# Patient Record
Sex: Female | Born: 1970 | Race: White | Hispanic: No | Marital: Married | Smoking: Current every day smoker
Health system: Southern US, Community
[De-identification: ages and names within clinical notes are randomized; demographics above are authoritative.]

## PROBLEM LIST (undated history)

## (undated) DIAGNOSIS — F445 Conversion disorder with seizures or convulsions: Secondary | ICD-10-CM

## (undated) DIAGNOSIS — F329 Major depressive disorder, single episode, unspecified: Secondary | ICD-10-CM

## (undated) DIAGNOSIS — F419 Anxiety disorder, unspecified: Secondary | ICD-10-CM

## (undated) DIAGNOSIS — G459 Transient cerebral ischemic attack, unspecified: Secondary | ICD-10-CM

## (undated) DIAGNOSIS — R569 Unspecified convulsions: Secondary | ICD-10-CM

## (undated) DIAGNOSIS — K219 Gastro-esophageal reflux disease without esophagitis: Secondary | ICD-10-CM

## (undated) HISTORY — PX: APPENDECTOMY: SHX54

## (undated) HISTORY — PX: BREAST SURGERY: SHX581

## (undated) HISTORY — PX: ABDOMINAL HYSTERECTOMY: SHX81

---

## 2005-10-13 ENCOUNTER — Inpatient Hospital Stay (HOSPITAL_COMMUNITY): Admission: RE | Admit: 2005-10-13 | Discharge: 2005-10-17 | Payer: Self-pay | Admitting: Psychiatry

## 2005-10-14 ENCOUNTER — Ambulatory Visit: Payer: Self-pay | Admitting: Psychiatry

## 2011-01-02 ENCOUNTER — Emergency Department (INDEPENDENT_AMBULATORY_CARE_PROVIDER_SITE_OTHER): Payer: Self-pay

## 2011-01-02 ENCOUNTER — Emergency Department (HOSPITAL_BASED_OUTPATIENT_CLINIC_OR_DEPARTMENT_OTHER)
Admission: EM | Admit: 2011-01-02 | Discharge: 2011-01-02 | Disposition: A | Discharge status: Home / Self Care | Payer: Self-pay | Attending: Emergency Medicine | Admitting: Emergency Medicine

## 2011-01-02 DIAGNOSIS — R109 Unspecified abdominal pain: Secondary | ICD-10-CM

## 2011-01-02 DIAGNOSIS — R1031 Right lower quadrant pain: Secondary | ICD-10-CM | POA: Insufficient documentation

## 2011-01-02 LAB — URINALYSIS, ROUTINE W REFLEX MICROSCOPIC
Bilirubin Urine: NEGATIVE
Glucose, UA: NEGATIVE mg/dL
Ketones, ur: NEGATIVE mg/dL
Protein, ur: NEGATIVE mg/dL
pH: 6.5 (ref 5.0–8.0)

## 2011-01-02 LAB — BASIC METABOLIC PANEL
BUN: 9 mg/dL (ref 6–23)
Calcium: 9.3 mg/dL (ref 8.4–10.5)
Creatinine, Ser: 0.8 mg/dL (ref 0.4–1.2)
GFR calc non Af Amer: >60 mL/min (ref >60)
Glucose, Bld: 114 mg/dL — ABNORMAL HIGH (ref 70–99)
Potassium: 3.8 mEq/L (ref 3.5–5.1)

## 2011-01-02 LAB — DIFFERENTIAL
Eosinophils Absolute: 0.1 10*3/uL (ref 0.0–0.7)
Eosinophils Relative: 1 % (ref 0–5)
Lymphocytes Relative: 28 % (ref 12–46)
Lymphs Abs: 2.1 10*3/uL (ref 0.7–4.0)
Monocytes Absolute: 0.3 10*3/uL (ref 0.1–1.0)
Monocytes Relative: 4 % (ref 3–12)

## 2011-01-02 LAB — CBC
HCT: 40.2 % (ref 36.0–46.0)
MCH: 31.9 pg (ref 26.0–34.0)
MCHC: 35.1 g/dL (ref 30.0–36.0)
MCV: 91 fL (ref 78.0–100.0)
Platelets: 216 10*3/uL (ref 150–400)
RDW: 12.5 % (ref 11.5–15.5)
WBC: 7.6 10*3/uL (ref 4.0–10.5)

## 2011-10-11 ENCOUNTER — Encounter (HOSPITAL_BASED_OUTPATIENT_CLINIC_OR_DEPARTMENT_OTHER): Payer: Self-pay | Admitting: Family Medicine

## 2011-10-11 ENCOUNTER — Emergency Department (HOSPITAL_BASED_OUTPATIENT_CLINIC_OR_DEPARTMENT_OTHER)
Admission: EM | Admit: 2011-10-11 | Discharge: 2011-10-11 | Disposition: A | Discharge status: Home / Self Care | Payer: Self-pay | Attending: Emergency Medicine | Admitting: Emergency Medicine

## 2011-10-11 ENCOUNTER — Emergency Department (INDEPENDENT_AMBULATORY_CARE_PROVIDER_SITE_OTHER): Payer: Self-pay

## 2011-10-11 DIAGNOSIS — S161XXA Strain of muscle, fascia and tendon at neck level, initial encounter: Secondary | ICD-10-CM

## 2011-10-11 DIAGNOSIS — R209 Unspecified disturbances of skin sensation: Secondary | ICD-10-CM | POA: Insufficient documentation

## 2011-10-11 DIAGNOSIS — M47812 Spondylosis without myelopathy or radiculopathy, cervical region: Secondary | ICD-10-CM

## 2011-10-11 DIAGNOSIS — M542 Cervicalgia: Secondary | ICD-10-CM

## 2011-10-11 DIAGNOSIS — X58XXXA Exposure to other specified factors, initial encounter: Secondary | ICD-10-CM | POA: Insufficient documentation

## 2011-10-11 DIAGNOSIS — S139XXA Sprain of joints and ligaments of unspecified parts of neck, initial encounter: Secondary | ICD-10-CM | POA: Insufficient documentation

## 2011-10-11 DIAGNOSIS — M25519 Pain in unspecified shoulder: Secondary | ICD-10-CM | POA: Insufficient documentation

## 2011-10-11 MED ORDER — IBUPROFEN 800 MG PO TABS
800.0000 mg | ORAL_TABLET | Freq: Once | ORAL | Status: AC
  Administered 2011-10-11: 800 mg via ORAL
  Filled 2011-10-11: qty 1

## 2011-10-11 MED ORDER — DIAZEPAM 5 MG PO TABS
5.0000 mg | ORAL_TABLET | Freq: Once | ORAL | Status: AC
  Administered 2011-10-11: 5 mg via ORAL
  Filled 2011-10-11: qty 1

## 2011-10-11 MED ORDER — HYDROCODONE-ACETAMINOPHEN 5-325 MG PO TABS: 2.0000 | ORAL_TABLET | Freq: Four times a day (QID) | ORAL | Status: AC | PRN

## 2011-10-11 MED ORDER — DIAZEPAM 5 MG PO TABS: 10.0000 mg | ORAL_TABLET | Freq: Three times a day (TID) | ORAL | Status: AC | PRN

## 2011-10-11 MED ORDER — HYDROCODONE-ACETAMINOPHEN 5-325 MG PO TABS
2.0000 | ORAL_TABLET | Freq: Once | ORAL | Status: AC
  Administered 2011-10-11: 2 via ORAL
  Filled 2011-10-11: qty 2

## 2011-10-11 NOTE — ED Notes (Signed)
Pt c/o left lateral neck pain x 7 days. Pt sts left arm is now feeling numb/tingling from left shoulder to elbow. Pt denies injury. Pt has equal grip and sensation to bilateral hands.

## 2011-10-11 NOTE — ED Provider Notes (Signed)
History  This chart was scribed for Cyndra Numbers, MD by Bennett Scrape. This patient was seen in room MH09/MH09 and the patient's care was started at 4:26PM.  CSN: [id number]  Arrival date & time 10/11/11  1437   First MD Initiated Contact with Patient 10/11/11 1543      Chief Complaint  Patient presents with  . Neck Pain    Patient is a 41 y.o. female presenting with neck pain. The history is provided by the patient. No language interpreter was used.  Neck Pain  This is a new problem. The current episode started more than 1 week ago. The problem occurs constantly. The problem has been gradually worsening. The pain is associated with nothing. There has been no fever. The pain is present in the left side. The pain radiates to the left scapula, left shoulder and left arm. The pain is the same all the time. Associated symptoms include numbness and tingling. Pertinent negatives include no photophobia, no visual change, no chest pain, no weight loss, no headaches, no bowel incontinence, no bladder incontinence, no leg pain and no weakness.    Savannah Golden is a 41 y.o. female who presents to the Emergency Department complaining of two weeks of gradual onset, gradually worsening, constant left-sided neck pain described as a nagging pain. Pt states that the pain radiates into the left shoulder and left arm with associated left arm tingling and numbness. Pt states that she woke up with the pain in her neck. The symptoms have been worse today. She denies injury as the cause of the pain. Pt reports that laying flat on her back improves symptoms.  She has been taking tylenol with no improvement in her symptoms. She reports that she has not seen her PCP for the symptoms. She denies any trouble swallowing or breathing as associate symptoms. She has no h/o chronic medical conditions. She is a current smoker but denies alcohol use.  History reviewed. No pertinent past medical history.  Past Surgical History    Procedure Date  . Appendectomy   . Breast surgery   . Abdominal hysterectomy     History reviewed. No pertinent family history.  History  Substance Use Topics  . Smoking status: Current Everyday Smoker  . Smokeless tobacco: Not on file  . Alcohol Use: No    Review of Systems  Constitutional: Negative for fever, chills and weight loss.  HENT: Positive for neck pain (Left-sided). Negative for sore throat, rhinorrhea and neck stiffness.   Eyes: Negative for photophobia and itching.  Respiratory: Negative for cough and shortness of breath.   Cardiovascular: Negative for chest pain.  Gastrointestinal: Negative for nausea, vomiting, abdominal pain, diarrhea and bowel incontinence.  Genitourinary: Negative for bladder incontinence, dysuria and hematuria.  Musculoskeletal: Negative for back pain.  Skin: Negative for rash.  Neurological: Positive for tingling and numbness. Negative for weakness and headaches.    Allergies  Review of patient's allergies indicates no known allergies.  Home Medications  No current outpatient prescriptions on file.  Triage Vitals: BP 137/86  Pulse 71  Temp(Src) 98.8 F (37.1 C) (Oral)  Resp 16  Ht 5\' 2"  (1.575 m)  Wt 145 lb (65.772 kg)  BMI 26.52 kg/m2  SpO2 100%  Physical Exam  Nursing note and vitals reviewed. Constitutional: She is oriented to person, place, and time. She appears well-developed and well-nourished.       Pt is tearful  HENT:  Head: Normocephalic and atraumatic.  Eyes: Conjunctivae and EOM are normal.  Neck: Normal range of motion. Neck supple.  Cardiovascular: Normal rate, regular rhythm and normal heart sounds.   Pulmonary/Chest: Effort normal and breath sounds normal. No respiratory distress.  Abdominal: Soft. There is no tenderness.  Musculoskeletal: Normal range of motion. She exhibits tenderness. She exhibits no edema.  Neurological: She is alert and oriented to person, place, and time. No cranial nerve deficit.   Skin: Skin is warm and dry. No rash noted.  Psychiatric: She has a normal mood and affect. Her behavior is normal.    ED Course  Procedures (including critical care time)  DIAGNOSTIC STUDIES: Oxygen Saturation is 100% on room air, normal by my interpretation.    COORDINATION OF CARE: 4:43PM-Discussed valium and x-ray with pt and pt agreed to plan.  Labs Reviewed - No data to display  Dg Cervical Spine Complete  10/11/2011  *RADIOLOGY REPORT*  Clinical Data: Left-sided neck pain, numbness and tingling.  CERVICAL SPINE - COMPLETE 4+ VIEW  Comparison: None.  Findings: There is straightening of the cervical spine.  There is mild disc space narrowing with small endplate osteophytes at C4-5 and C5-6.  No apparent significant encroachment upon the canal or foramina.  No facet arthropathy.  No focal osseous lesion.  IMPRESSION: Mid cervical spondylosis at C4-5 and C5-6.  No apparent osteophytic encroachment upon the canal or foramina.  Original Report Authenticated By: Thomasenia Sales, M.D.     1. Neck strain       MDM  Patient was evaluated and was hemodynamically stable.  She did complain of occasional decrease in sensation along lateral left arm.  Plain film was performed to confirm no bony pathology.  This was unremarkable and symptoms were much improved with valium as muscle relaxant and vicodin.  Patient was discharged with prescriptions for both in improved condition.    I personally performed the services described in this documentation, which was scribed in my presence. The recorded information has been reviewed and considered.       Cyndra Numbers, MD 10/13/11 1037

## 2012-12-16 ENCOUNTER — Encounter (HOSPITAL_BASED_OUTPATIENT_CLINIC_OR_DEPARTMENT_OTHER): Payer: Self-pay | Admitting: *Deleted

## 2012-12-16 ENCOUNTER — Emergency Department (HOSPITAL_BASED_OUTPATIENT_CLINIC_OR_DEPARTMENT_OTHER)
Admission: EM | Admit: 2012-12-16 | Discharge: 2012-12-17 | Disposition: A | Discharge status: Home / Self Care | Payer: Self-pay | Attending: Emergency Medicine | Admitting: Emergency Medicine

## 2012-12-16 DIAGNOSIS — R630 Anorexia: Secondary | ICD-10-CM | POA: Insufficient documentation

## 2012-12-16 DIAGNOSIS — Z9089 Acquired absence of other organs: Secondary | ICD-10-CM | POA: Insufficient documentation

## 2012-12-16 DIAGNOSIS — IMO0001 Reserved for inherently not codable concepts without codable children: Secondary | ICD-10-CM | POA: Insufficient documentation

## 2012-12-16 DIAGNOSIS — R059 Cough, unspecified: Secondary | ICD-10-CM | POA: Insufficient documentation

## 2012-12-16 DIAGNOSIS — J069 Acute upper respiratory infection, unspecified: Secondary | ICD-10-CM | POA: Insufficient documentation

## 2012-12-16 DIAGNOSIS — R05 Cough: Secondary | ICD-10-CM

## 2012-12-16 DIAGNOSIS — R509 Fever, unspecified: Secondary | ICD-10-CM | POA: Insufficient documentation

## 2012-12-16 DIAGNOSIS — G40909 Epilepsy, unspecified, not intractable, without status epilepticus: Secondary | ICD-10-CM | POA: Insufficient documentation

## 2012-12-16 DIAGNOSIS — Z79899 Other long term (current) drug therapy: Secondary | ICD-10-CM | POA: Insufficient documentation

## 2012-12-16 DIAGNOSIS — Z90710 Acquired absence of both cervix and uterus: Secondary | ICD-10-CM | POA: Insufficient documentation

## 2012-12-16 DIAGNOSIS — R110 Nausea: Secondary | ICD-10-CM | POA: Insufficient documentation

## 2012-12-16 DIAGNOSIS — F17200 Nicotine dependence, unspecified, uncomplicated: Secondary | ICD-10-CM | POA: Insufficient documentation

## 2012-12-16 HISTORY — DX: Unspecified convulsions: R56.9

## 2012-12-16 LAB — URINALYSIS, ROUTINE W REFLEX MICROSCOPIC
Ketones, ur: 15 mg/dL — AB
Nitrite: NEGATIVE
Protein, ur: NEGATIVE mg/dL
pH: 5 (ref 5.0–8.0)

## 2012-12-16 NOTE — ED Notes (Signed)
Pt states she was awakened at 0500 with RUQ pain.. Fever 101 since Thursday. Taking Ibuprofen. Dry cough. +nausea. Decreased appetite.

## 2012-12-17 ENCOUNTER — Emergency Department (HOSPITAL_BASED_OUTPATIENT_CLINIC_OR_DEPARTMENT_OTHER): Payer: Self-pay

## 2012-12-17 LAB — CBC WITH DIFFERENTIAL/PLATELET
Eosinophils Relative: 0 % (ref 0–5)
HCT: 43.4 % (ref 36.0–46.0)
Lymphocytes Relative: 27 % (ref 12–46)
Lymphs Abs: 1.5 10*3/uL (ref 0.7–4.0)
MCV: 93.9 fL (ref 78.0–100.0)
Neutro Abs: 3.7 10*3/uL (ref 1.7–7.7)
Platelets: 166 10*3/uL (ref 150–400)
RBC: 4.62 MIL/uL (ref 3.87–5.11)
WBC: 5.8 10*3/uL (ref 4.0–10.5)

## 2012-12-17 LAB — COMPREHENSIVE METABOLIC PANEL
ALT: 10 U/L (ref 0–35)
Alkaline Phosphatase: 89 U/L (ref 39–117)
CO2: 22 mEq/L (ref 19–32)
Calcium: 9.5 mg/dL (ref 8.4–10.5)
Chloride: 104 mEq/L (ref 96–112)
GFR calc Af Amer: >90 mL/min (ref >90)
GFR calc non Af Amer: >90 mL/min (ref >90)
Glucose, Bld: 105 mg/dL — ABNORMAL HIGH (ref 70–99)
Potassium: 3.6 mEq/L (ref 3.5–5.1)
Sodium: 140 mEq/L (ref 135–145)
Total Bilirubin: 0.3 mg/dL (ref 0.3–1.2)

## 2012-12-17 MED ORDER — LORATADINE 10 MG PO TABS: 10.0000 mg | ORAL_TABLET | Freq: Every day | ORAL | Status: DC

## 2012-12-17 MED ORDER — OXYCODONE-ACETAMINOPHEN 5-325 MG PO TABS
1.0000 | ORAL_TABLET | Freq: Once | ORAL | Status: AC
  Administered 2012-12-17: 1 via ORAL
  Filled 2012-12-17 (×2): qty 1

## 2012-12-17 MED ORDER — NAPROXEN 375 MG PO TABS: 375.0000 mg | ORAL_TABLET | Freq: Two times a day (BID) | ORAL | Status: DC

## 2012-12-17 MED ORDER — BENZONATATE 100 MG PO CAPS: 100.0000 mg | ORAL_CAPSULE | Freq: Three times a day (TID) | ORAL | Status: DC

## 2012-12-17 MED ORDER — KETOROLAC TROMETHAMINE 60 MG/2ML IM SOLN
60.0000 mg | Freq: Once | INTRAMUSCULAR | Status: AC
  Administered 2012-12-17: 60 mg via INTRAMUSCULAR
  Filled 2012-12-17: qty 2

## 2012-12-17 NOTE — ED Provider Notes (Signed)
History    This chart was scribed for Savannah Golden Smitty Cords, MD scribed by Magnus Sinning. The patient was seen in room MH10/MH10 00:19  CSN: [id number]  Arrival date & time 12/16/12  2326   None     Chief Complaint  Patient presents with  . Abdominal Pain    (Consider location/radiation/quality/duration/timing/severity/associated sxs/prior treatment) Patient is a 42 y.o. female presenting with abdominal pain. The history is provided by the patient. No language interpreter was used.  Abdominal Pain Pain location:  R flank Pain quality: aching   Pain radiates to:  Does not radiate Pain severity:  Moderate Onset quality:  Gradual Timing:  Constant Progression:  Unchanged Chronicity:  New Context: recent illness   Context comment:  Has been coughing for several days Relieved by:  Nothing Worsened by:  Nothing tried Associated symptoms: cough, fever and nausea   Associated symptoms: no diarrhea, no shortness of breath, no sore throat and no vomiting   Cough:    Cough characteristics:  Non-productive   Severity:  Moderate   Onset quality:  Gradual   Timing:  Intermittent   Chronicity:  New  Savannah Golden is a 42 y.o. female who presents to the Emergency Department complaining of constant moderate abd pain, onset yesterday morning with associated dry cough, onset 4 days days decreased appetite and nausea.  She has had a dry cough and subjective fevers and myalgias since Thursday am.  Pt states at 4:30am this morning she started having abd pain that is aggravated by cough.  The patient states that she does smoke. Past Medical History  Diagnosis Date  . Seizures     Past Surgical History  Procedure Laterality Date  . Appendectomy    . Breast surgery    . Abdominal hysterectomy      History reviewed. No pertinent family history.  History  Substance Use Topics  . Smoking status: Current Every Day Smoker  . Smokeless tobacco: Not on file  . Alcohol Use: No     Review  of Systems  Constitutional: Positive for fever and appetite change.  HENT: Negative for sore throat.   Respiratory: Positive for cough. Negative for shortness of breath.   Gastrointestinal: Positive for nausea and abdominal pain. Negative for vomiting and diarrhea.  All other systems reviewed and are negative.    Allergies  Review of patient's allergies indicates no known allergies.  Home Medications   Current Outpatient Rx  Name  Route  Sig  Dispense  Refill  . lamoTRIgine (LAMICTAL) 100 MG tablet   Oral   Take 100 mg by mouth 3 (three) times daily.           BP 118/79  Pulse 87  Temp(Src) 98.9 F (37.2 C) (Oral)  SpO2 100%  Physical Exam  Nursing note and vitals reviewed. Constitutional: She is oriented to person, place, and time. She appears well-developed and well-nourished. No distress.  HENT:  Head: Normocephalic and atraumatic.  Mouth/Throat: Oropharynx is clear and moist. No oropharyngeal exudate.  Eyes: Conjunctivae and EOM are normal. Pupils are equal, round, and reactive to light.  Neck: Normal range of motion. Neck supple. No tracheal deviation present.  Cardiovascular: Normal rate, regular rhythm and intact distal pulses.   Pulmonary/Chest: Effort normal and breath sounds normal. No stridor. No respiratory distress. She has no wheezes. She has no rales.  Abdominal: Soft. She exhibits no distension. There is no tenderness. There is no rebound and no guarding.  Hyperactive bowel sounds  Musculoskeletal: Normal  range of motion.  Lymphadenopathy:    She has no cervical adenopathy.  Neurological: She is alert and oriented to person, place, and time. No sensory deficit.  Skin: Skin is warm and dry.  Psychiatric: She has a normal mood and affect. Her behavior is normal.    ED Course  Procedures (including critical care time) DIAGNOSTIC STUDIES: Oxygen Saturation is 100% on room air, normal by my interpretation.    COORDINATION OF CARE:  Labs Reviewed   URINALYSIS, ROUTINE W REFLEX MICROSCOPIC - Abnormal; Notable for the following:    Color, Urine AMBER (*)    APPearance CLOUDY (*)    Specific Gravity, Urine 1.035 (*)    Hgb urine dipstick SMALL (*)    Bilirubin Urine SMALL (*)    Ketones, ur 15 (*)    All other components within normal limits  CBC WITH DIFFERENTIAL - Abnormal; Notable for the following:    Hemoglobin 15.1 (*)    All other components within normal limits  URINE MICROSCOPIC-ADD ON - Abnormal; Notable for the following:    Squamous Epithelial / LPF FEW (*)    Bacteria, UA FEW (*)    All other components within normal limits  COMPREHENSIVE METABOLIC PANEL  LIPASE, BLOOD   No results found.   No diagnosis found.   PERC negative and wells 0 MDM  Suspect pain is secondary to pulled muscle from coughing.  Will treat for URI.  Return for worsening symptoms   I personally performed the services described in this documentation, which was scribed in my presence. The recorded information has been reviewed and is accurate.          Jasmine Awe, MD 12/17/12 [phone]

## 2014-09-16 ENCOUNTER — Emergency Department (HOSPITAL_BASED_OUTPATIENT_CLINIC_OR_DEPARTMENT_OTHER): Payer: Self-pay

## 2014-09-16 ENCOUNTER — Observation Stay (HOSPITAL_BASED_OUTPATIENT_CLINIC_OR_DEPARTMENT_OTHER)
Admission: EM | Admit: 2014-09-16 | Discharge: 2014-09-17 | Disposition: A | Discharge status: Home / Self Care | Payer: Self-pay | Attending: Internal Medicine | Admitting: Internal Medicine

## 2014-09-16 ENCOUNTER — Encounter (HOSPITAL_BASED_OUTPATIENT_CLINIC_OR_DEPARTMENT_OTHER): Payer: Self-pay | Admitting: *Deleted

## 2014-09-16 DIAGNOSIS — R569 Unspecified convulsions: Principal | ICD-10-CM | POA: Insufficient documentation

## 2014-09-16 DIAGNOSIS — K219 Gastro-esophageal reflux disease without esophagitis: Secondary | ICD-10-CM | POA: Insufficient documentation

## 2014-09-16 DIAGNOSIS — W010XXA Fall on same level from slipping, tripping and stumbling without subsequent striking against object, initial encounter: Secondary | ICD-10-CM | POA: Insufficient documentation

## 2014-09-16 DIAGNOSIS — Y9389 Activity, other specified: Secondary | ICD-10-CM | POA: Insufficient documentation

## 2014-09-16 DIAGNOSIS — R471 Dysarthria and anarthria: Secondary | ICD-10-CM

## 2014-09-16 DIAGNOSIS — Z8673 Personal history of transient ischemic attack (TIA), and cerebral infarction without residual deficits: Secondary | ICD-10-CM | POA: Insufficient documentation

## 2014-09-16 DIAGNOSIS — F17210 Nicotine dependence, cigarettes, uncomplicated: Secondary | ICD-10-CM | POA: Insufficient documentation

## 2014-09-16 DIAGNOSIS — R55 Syncope and collapse: Secondary | ICD-10-CM | POA: Insufficient documentation

## 2014-09-16 DIAGNOSIS — Y9289 Other specified places as the place of occurrence of the external cause: Secondary | ICD-10-CM | POA: Insufficient documentation

## 2014-09-16 DIAGNOSIS — Y998 Other external cause status: Secondary | ICD-10-CM | POA: Insufficient documentation

## 2014-09-16 DIAGNOSIS — R202 Paresthesia of skin: Secondary | ICD-10-CM | POA: Insufficient documentation

## 2014-09-16 HISTORY — DX: Gastro-esophageal reflux disease without esophagitis: K21.9

## 2014-09-16 HISTORY — DX: Transient cerebral ischemic attack, unspecified: G45.9

## 2014-09-16 LAB — CBC WITH DIFFERENTIAL/PLATELET
BASOS ABS: 0 10*3/uL (ref 0.0–0.1)
BASOS PCT: 0 % (ref 0–1)
EOS ABS: 0.1 10*3/uL (ref 0.0–0.7)
Eosinophils Relative: 1 % (ref 0–5)
HCT: 43.3 % (ref 36.0–46.0)
Hemoglobin: 14.9 g/dL (ref 12.0–15.0)
Lymphocytes Relative: 34 % (ref 12–46)
Lymphs Abs: 2.5 10*3/uL (ref 0.7–4.0)
MCH: 32.3 pg (ref 26.0–34.0)
MCHC: 34.4 g/dL (ref 30.0–36.0)
MCV: 93.7 fL (ref 78.0–100.0)
MONOS PCT: 8 % (ref 3–12)
Monocytes Absolute: 0.5 10*3/uL (ref 0.1–1.0)
NEUTROS PCT: 57 % (ref 43–77)
Neutro Abs: 4.1 10*3/uL (ref 1.7–7.7)
PLATELETS: 207 10*3/uL (ref 150–400)
RBC: 4.62 MIL/uL (ref 3.87–5.11)
RDW: 12.2 % (ref 11.5–15.5)
WBC: 7.2 10*3/uL (ref 4.0–10.5)

## 2014-09-16 LAB — URINALYSIS, ROUTINE W REFLEX MICROSCOPIC
Glucose, UA: NEGATIVE mg/dL
Hgb urine dipstick: NEGATIVE
Ketones, ur: 15 mg/dL — AB
Leukocytes, UA: NEGATIVE
Nitrite: NEGATIVE
PH: 6 (ref 5.0–8.0)
Protein, ur: NEGATIVE mg/dL
SPECIFIC GRAVITY, URINE: 1.026 (ref 1.005–1.030)
UROBILINOGEN UA: 1 mg/dL (ref 0.0–1.0)

## 2014-09-16 LAB — COMPREHENSIVE METABOLIC PANEL
ALBUMIN: 3.9 g/dL (ref 3.5–5.2)
ALK PHOS: 73 U/L (ref 39–117)
ALT: 10 U/L (ref 0–35)
ANION GAP: 13 (ref 5–15)
AST: 13 U/L (ref 0–37)
BUN: 12 mg/dL (ref 6–23)
CO2: 27 mEq/L (ref 19–32)
Calcium: 9.6 mg/dL (ref 8.4–10.5)
Chloride: 103 mEq/L (ref 96–112)
Creatinine, Ser: 0.8 mg/dL (ref 0.50–1.10)
GFR calc Af Amer: >90 mL/min (ref >90)
GFR calc non Af Amer: 90 mL/min — ABNORMAL LOW (ref >90)
Glucose, Bld: 101 mg/dL — ABNORMAL HIGH (ref 70–99)
POTASSIUM: 3.5 meq/L — AB (ref 3.7–5.3)
SODIUM: 143 meq/L (ref 137–147)
TOTAL PROTEIN: 6.6 g/dL (ref 6.0–8.3)
Total Bilirubin: 0.3 mg/dL (ref 0.3–1.2)

## 2014-09-16 NOTE — ED Notes (Signed)
Patient would like to go to [facility] rather than [facility]. The delay explained to the patient and family.

## 2014-09-16 NOTE — ED Provider Notes (Signed)
CSN: [id number]     Arrival date & time 09/16/14  1744 History  This chart was scribed for Tilden FossaElizabeth Burgundy Matuszak, MD by [facility] Abu Hashem, ED Scribe. The patient was seen in MH08/MH08 and the patient's care was started at 7:12 PM.   Chief Complaint  Patient presents with  . Numbness   The history is provided by the patient and the spouse. No language interpreter was used.    HPI Comments: Butler DenmarkDebra Spangler is a 43 y.o. female with a history of seizures who presents to the Emergency Department complaining of difficulty with speech onset last night. She has numbness on the left side of her face and teeth. The sensation is described as an ache and tightness. Pt was very stressed last night and had a syncope episode. He she fell down but did not hit her head. Shortly after the fall pt had a seizure. After coming too she appeared confused, saw doubles and had difficulty with speech. Her husband helped her up and her gait was abnormal described as "high stepping". The seizure lasted 2 min, pt is taking Lamictal and is complaint with her medication.   She has FHx of DM and heart attack. Pt does smoke.  Past Medical History  Diagnosis Date  . Seizures   . TIA (transient ischemic attack)    Past Surgical History  Procedure Laterality Date  . Appendectomy    . Breast surgery    . Abdominal hysterectomy     No family history on file. History  Substance Use Topics  . Smoking status: Current Every Day Smoker  . Smokeless tobacco: Not on file  . Alcohol Use: No   OB History    No data available     Review of Systems  HENT: Positive for voice change.   Eyes: Positive for visual disturbance.  Musculoskeletal: Positive for gait problem.  Neurological: Positive for numbness.  All other systems reviewed and are negative.   Allergies  Review of patient's allergies indicates no known allergies.  Home Medications   Prior to Admission medications   Medication Sig Start Date End Date Taking? Authorizing  Provider  lamoTRIgine (LAMICTAL) 100 MG tablet Take 100 mg by mouth 3 (three) times daily.    Historical Provider, MD   BP 118/72 mmHg  Pulse 83  Temp(Src) 98.3 F (36.8 C) (Oral)  Resp 18  Ht 5\' 2"  (1.575 m)  Wt 114 lb (51.71 kg)  BMI 20.85 kg/m2  SpO2 100% Physical Exam  Constitutional: She is oriented to person, place, and time. She appears well-developed and well-nourished.  HENT:  Head: Normocephalic and atraumatic.  Cardiovascular: Normal rate and regular rhythm.   No murmur heard. Pulmonary/Chest: Effort normal and breath sounds normal. No respiratory distress.  Abdominal: Soft. There is no tenderness. There is no rebound and no guarding.  Musculoskeletal: She exhibits no edema or tenderness.  Neurological: She is alert and oriented to person, place, and time.  Decreased sensation to light touch and slight weakness of lower face. 5/5 strength in all four extremities, sensation to light touch intact in all four extremities.  Slightly mumbled speech.  No pronator drift.   Skin: Skin is warm and dry.  Psychiatric: She has a normal mood and affect. Her behavior is normal.  Nursing note and vitals reviewed.   ED Course  Procedures  DIAGNOSTIC STUDIES: Oxygen Saturation is 100% on room air, normal by my interpretation.    COORDINATION OF CARE: 7:18 PM Discussed treatment plan with pt at  bedside and pt agreed to plan.  Labs Review Labs Reviewed  COMPREHENSIVE METABOLIC PANEL - Abnormal; Notable for the following:    Potassium 3.5 (*)    Glucose, Bld 101 (*)    GFR calc non Af Amer 90 (*)    All other components within normal limits  URINALYSIS, ROUTINE W REFLEX MICROSCOPIC - Abnormal; Notable for the following:    APPearance CLOUDY (*)    Bilirubin Urine SMALL (*)    Ketones, ur 15 (*)    All other components within normal limits  CBC WITH DIFFERENTIAL   Imaging Review Dg Chest 2 View  09/16/2014   CLINICAL DATA:  Syncopal episode with fall and unresponsiveness  last night. Subsequent possible seizure activity. Confusion. Initial encounter.  EXAM: CHEST  2 VIEW  COMPARISON:  Radiographs 08/07/2014.  FINDINGS: The heart size and mediastinal contours are normal. The lungs are clear. There is no pleural effusion or pneumothorax. No acute osseous findings are identified.  IMPRESSION: No active cardiopulmonary process.   Electronically Signed   By: Roxy HorsemanBill  Veazey M.D.   On: 09/16/2014 20:59   Ct Head Wo Contrast  09/16/2014   CLINICAL DATA:  Initial evaluation for numbness.  EXAM: CT HEAD WITHOUT CONTRAST  TECHNIQUE: Contiguous axial images were obtained from the base of the skull through the vertex without intravenous contrast.  COMPARISON:  Prior CT from 01/09/2013  FINDINGS: There is no acute intracranial hemorrhage or infarct. No mass lesion or midline shift. Gray-white matter differentiation is well maintained. Ventricles are normal in size without evidence of hydrocephalus. CSF containing spaces are within normal limits. No extra-axial fluid collection.  The calvarium is intact.  Orbital soft tissues are within normal limits.  The paranasal sinuses and mastoid air cells are well pneumatized and free of fluid.  Scalp soft tissues are unremarkable.  IMPRESSION: Normal head CT with no acute intracranial process identified.   Electronically Signed   By: Rise MuBenjamin  McClintock M.D.   On: 09/16/2014 21:06     EKG Interpretation   Date/Time:  Monday September 16 2014 20:05:23 EST Ventricular Rate:  69 PR Interval:  152 QRS Duration: 102 QT Interval:  408 QTC Calculation: 437 R Axis:   0 Text Interpretation:  Normal sinus rhythm with sinus arrhythmia Incomplete  right bundle branch block Borderline ECG Confirmed by Lincoln Brighamees, Liz [phone]([id number]) on  09/16/2014 8:28:10 PM      MDM   Final diagnoses:  Paresthesia  Dysarthria    Patient here for evaluation of left-sided facial numbness as well as speech changes that have been ongoing since yesterday after a seizure  episode. Patient does have some speech abnormalities as well as mild left-sided lower facial weakness. There are no changes on CT of her brain consistent with mass or recent stroke. Discussed with neurohospitalist, Dr. Hosie PoissonSumner, patient's history, presentation, examination, he recommends admission for further evaluation to rule out stroke. Patient is not a TPA candidate given symptoms have been ongoing for 24 hours. Discussed with hospitalist, Dr. Selena BattenKim regarding admission and transfer.  Patient updated of plan and [facility]  I personally performed the services described in this documentation, which was scribed in my presence. The recorded information has been reviewed and is accurate.   Tilden FossaElizabeth Wava Kildow, MD 09/16/14 [phone]

## 2014-09-16 NOTE — Progress Notes (Signed)
Patient arrived to [facility] from [facility]. Patient oriented to room and unit. VSS. Admissions paged about patients arrival to hospital. Will continue to monitor closely. Monia PouchShakenna Trinka Keshishyan, RN

## 2014-09-16 NOTE — ED Notes (Signed)
2330 last pm syncopal episode with seizure  Since   Garbled speech  And numbness left arm and side of face

## 2014-09-16 NOTE — ED Notes (Signed)
Pt reports a "stressful" event last night around 2230.  Husband states pt walked into a room, started to speak to him and "fell to the ground".  Pt was unresponsive for approximately 3 minutes with seizure activity/movement.  Pt was confused and speech was abnormal described as slurred.  She also had an abnormal gait described as "high stepping" with husband having to hold her up.  Pt tells me she has a sensation of tightening of left side of face and teeth hurting, left arm is aching and she just doesn't feel right.  Pt is compliant with medications, denies headache or chest pain.

## 2014-09-16 NOTE — Progress Notes (Signed)
ED requested evaluation of pt for seizure.  Neurology has apparently agreed to consult per ED.  [facility].

## 2014-09-16 NOTE — ED Notes (Signed)
MD at bedside and patient is now agreeable to go to [facility]

## 2014-09-17 ENCOUNTER — Observation Stay (HOSPITAL_COMMUNITY): Payer: Self-pay

## 2014-09-17 ENCOUNTER — Encounter (HOSPITAL_COMMUNITY): Payer: Self-pay | Admitting: Internal Medicine

## 2014-09-17 DIAGNOSIS — R55 Syncope and collapse: Secondary | ICD-10-CM | POA: Diagnosis present

## 2014-09-17 DIAGNOSIS — R569 Unspecified convulsions: Secondary | ICD-10-CM

## 2014-09-17 DIAGNOSIS — R202 Paresthesia of skin: Secondary | ICD-10-CM

## 2014-09-17 LAB — TROPONIN I

## 2014-09-17 MED ORDER — ACETAMINOPHEN 650 MG RE SUPP: 650.0000 mg | Freq: Four times a day (QID) | RECTAL | Status: DC | PRN

## 2014-09-17 MED ORDER — LAMOTRIGINE 100 MG PO TABS: ORAL_TABLET | ORAL | Status: DC

## 2014-09-17 MED ORDER — PANTOPRAZOLE SODIUM 40 MG PO TBEC
40.0000 mg | DELAYED_RELEASE_TABLET | Freq: Every day | ORAL | Status: DC
  Administered 2014-09-17: 40 mg via ORAL
  Filled 2014-09-17 (×2): qty 1

## 2014-09-17 MED ORDER — ZOLPIDEM TARTRATE 5 MG PO TABS
5.0000 mg | ORAL_TABLET | Freq: Every evening | ORAL | Status: DC | PRN
  Administered 2014-09-17: 5 mg via ORAL
  Filled 2014-09-17: qty 1

## 2014-09-17 MED ORDER — SODIUM CHLORIDE 0.9 % IV SOLN
INTRAVENOUS | Status: DC
  Administered 2014-09-17: 1000 mL via INTRAVENOUS

## 2014-09-17 MED ORDER — POTASSIUM CHLORIDE CRYS ER 20 MEQ PO TBCR
20.0000 meq | EXTENDED_RELEASE_TABLET | Freq: Once | ORAL | Status: AC
  Administered 2014-09-17: 20 meq via ORAL
  Filled 2014-09-17: qty 1

## 2014-09-17 MED ORDER — ASPIRIN EC 325 MG PO TBEC
325.0000 mg | DELAYED_RELEASE_TABLET | Freq: Every day | ORAL | Status: DC
  Administered 2014-09-17: 325 mg via ORAL
  Filled 2014-09-17: qty 1

## 2014-09-17 MED ORDER — LORAZEPAM 2 MG/ML IJ SOLN
0.5000 mg | INTRAMUSCULAR | Status: DC | PRN
  Administered 2014-09-17: 0.5 mg via INTRAVENOUS

## 2014-09-17 MED ORDER — ACETAMINOPHEN 325 MG PO TABS: 650.0000 mg | ORAL_TABLET | Freq: Four times a day (QID) | ORAL | Status: DC | PRN

## 2014-09-17 MED ORDER — LAMOTRIGINE 100 MG PO TABS
100.0000 mg | ORAL_TABLET | Freq: Three times a day (TID) | ORAL | Status: DC
  Administered 2014-09-17 (×2): 100 mg via ORAL
  Filled 2014-09-17 (×2): qty 1

## 2014-09-17 MED ORDER — LORAZEPAM 2 MG/ML IJ SOLN
INTRAMUSCULAR | Status: AC
  Filled 2014-09-17: qty 1

## 2014-09-17 MED ORDER — SODIUM CHLORIDE 0.9 % IJ SOLN
3.0000 mL | Freq: Two times a day (BID) | INTRAMUSCULAR | Status: DC
  Administered 2014-09-17: 3 mL via INTRAVENOUS

## 2014-09-17 NOTE — Procedures (Signed)
History: 43 yo F with seizures  Sedation: None  Technique: This is a 17 channel routine scalp EEG performed at the bedside with bipolar and monopolar montages arranged in accordance to the international 10/20 system of electrode placement. One channel was dedicated to EKG recording.    Background: There is a well defined posterior dominant rhythm of 9 Hz that attenuates with eye opening. The waking background consists of intermixed alpha and beta activities. There is anterior shifting of the PDR with drowsiness and sleep structures with a normla distribution are observed.   Photic stimulation: Physiologic driving is present  EEG Abnormalities: None  Clinical Interpretation: This normal EEG is recorded in the waking and sleep state. There was no seizure or seizure predisposition recorded on this study.   Ritta SlotMcNeill Asianae Minkler, MD [facility] [phone]  If 7pm- 7am, please page neurology on call as listed in AMION.

## 2014-09-17 NOTE — Progress Notes (Signed)
Patient discharged home with husband. Discharge instructions and medications reviewed and given to patient. Both patient and husband state they understand discharge instructions and medications. IV removed, tele removed. Patient told to follow up with primary care doctor in a week.

## 2014-09-17 NOTE — H&P (Addendum)
Savannah Golden is an 43 y.o. female.     Pcp: [facility]  Chief Complaint: syncope HPI: 43 yo female with hx of seizure apparently about 10:30-pm last nite collapsed on floor and had seizure. "generalized trembling"  Witnessed by her husband, speech was like a baby speech when she came to.  Blurred vision when she initially regained consciousness.  No incontinence or tongue biting.  Pt went to ED for evaluation and CT scan was negative.  Sent to [facility] for evaluation and MRI.    Past Medical History  Diagnosis Date  . Seizures   . TIA (transient ischemic attack)   . GERD (gastroesophageal reflux disease)     Past Surgical History  Procedure Laterality Date  . Appendectomy    . Breast surgery    . Abdominal hysterectomy      Family History  Problem Relation Age of Onset  . Diabetes Father     had amputation due to diabetes   Social History:  reports that she has been smoking Cigarettes.  She has a 20 pack-year smoking history. She does not have any smokeless tobacco history on file. She reports that she does not drink alcohol or use illicit drugs.  Allergies: No Known Allergies  Medications Prior to Admission  Medication Sig Dispense Refill  . lamoTRIgine (LAMICTAL) 100 MG tablet Take 100 mg by mouth 3 (three) times daily.      Results for orders placed or performed during the hospital encounter of 09/16/14 (from the past 48 hour(s))  CBC with Differential     Status: None   Collection Time: 09/16/14  6:53 PM  Result Value Ref Range   WBC 7.2 4.0 - 10.5 K/uL   RBC 4.62 3.87 - 5.11 MIL/uL   Hemoglobin 14.9 12.0 - 15.0 g/dL   HCT 43.3 36.0 - 46.0 %   MCV 93.7 78.0 - 100.0 fL   MCH 32.3 26.0 - 34.0 pg   MCHC 34.4 30.0 - 36.0 g/dL   RDW 12.2 11.5 - 15.5 %   Platelets 207 150 - 400 K/uL   Neutrophils Relative % 57 43 - 77 %   Neutro Abs 4.1 1.7 - 7.7 K/uL   Lymphocytes Relative 34 12 - 46 %   Lymphs Abs 2.5 0.7 - 4.0 K/uL   Monocytes Relative 8 3 - 12  %   Monocytes Absolute 0.5 0.1 - 1.0 K/uL   Eosinophils Relative 1 0 - 5 %   Eosinophils Absolute 0.1 0.0 - 0.7 K/uL   Basophils Relative 0 0 - 1 %   Basophils Absolute 0.0 0.0 - 0.1 K/uL  Comprehensive metabolic panel     Status: Abnormal   Collection Time: 09/16/14  6:53 PM  Result Value Ref Range   Sodium 143 137 - 147 mEq/L   Potassium 3.5 (L) 3.7 - 5.3 mEq/L   Chloride 103 96 - 112 mEq/L   CO2 27 19 - 32 mEq/L   Glucose, Bld 101 (H) 70 - 99 mg/dL   BUN 12 6 - 23 mg/dL   Creatinine, Ser 0.80 0.50 - 1.10 mg/dL   Calcium 9.6 8.4 - 10.5 mg/dL   Total Protein 6.6 6.0 - 8.3 g/dL   Albumin 3.9 3.5 - 5.2 g/dL   AST 13 0 - 37 U/L   ALT 10 0 - 35 U/L   Alkaline Phosphatase 73 39 - 117 U/L   Total Bilirubin 0.3 0.3 - 1.2 mg/dL   GFR calc non Af Wyvonnia Lora  90 (L) >90 mL/min   GFR calc Af Amer >90 >90 mL/min    Comment: (NOTE) The eGFR has been calculated using the CKD EPI equation. This calculation has not been validated in all clinical situations. eGFR's persistently <90 mL/min signify possible Chronic Kidney Disease.    Anion gap 13 5 - 15  Urinalysis, Routine w reflex microscopic     Status: Abnormal   Collection Time: 09/16/14  8:08 PM  Result Value Ref Range   Color, Urine YELLOW YELLOW   APPearance CLOUDY (A) CLEAR   Specific Gravity, Urine 1.026 1.005 - 1.030   pH 6.0 5.0 - 8.0   Glucose, UA NEGATIVE NEGATIVE mg/dL   Hgb urine dipstick NEGATIVE NEGATIVE   Bilirubin Urine SMALL (A) NEGATIVE   Ketones, ur 15 (A) NEGATIVE mg/dL   Protein, ur NEGATIVE NEGATIVE mg/dL   Urobilinogen, UA 1.0 0.0 - 1.0 mg/dL   Nitrite NEGATIVE NEGATIVE   Leukocytes, UA NEGATIVE NEGATIVE    Comment: MICROSCOPIC NOT DONE ON URINES WITH NEGATIVE PROTEIN, BLOOD, LEUKOCYTES, NITRITE, OR GLUCOSE <1000 mg/dL.   Dg Chest 2 View  09/16/2014   CLINICAL DATA:  Syncopal episode with fall and unresponsiveness last night. Subsequent possible seizure activity. Confusion. Initial encounter.  EXAM: CHEST  2 VIEW   COMPARISON:  Radiographs 08/07/2014.  FINDINGS: The heart size and mediastinal contours are normal. The lungs are clear. There is no pleural effusion or pneumothorax. No acute osseous findings are identified.  IMPRESSION: No active cardiopulmonary process.   Electronically Signed   By: Camie Patience M.D.   On: 09/16/2014 20:59   Ct Head Wo Contrast  09/16/2014   CLINICAL DATA:  Initial evaluation for numbness.  EXAM: CT HEAD WITHOUT CONTRAST  TECHNIQUE: Contiguous axial images were obtained from the base of the skull through the vertex without intravenous contrast.  COMPARISON:  Prior CT from 01/09/2013  FINDINGS: There is no acute intracranial hemorrhage or infarct. No mass lesion or midline shift. Gray-white matter differentiation is well maintained. Ventricles are normal in size without evidence of hydrocephalus. CSF containing spaces are within normal limits. No extra-axial fluid collection.  The calvarium is intact.  Orbital soft tissues are within normal limits.  The paranasal sinuses and mastoid air cells are well pneumatized and free of fluid.  Scalp soft tissues are unremarkable.  IMPRESSION: Normal head CT with no acute intracranial process identified.   Electronically Signed   By: Jeannine Boga M.D.   On: 09/16/2014 21:06    Review of Systems  Constitutional: Negative for fever, chills, weight loss, malaise/fatigue and diaphoresis.  HENT: Negative for congestion, ear discharge, ear pain, hearing loss, nosebleeds, sore throat and tinnitus.   Eyes: Negative for blurred vision, double vision, photophobia, pain, discharge and redness.  Respiratory: Negative for cough, hemoptysis, sputum production, shortness of breath and stridor.   Cardiovascular: Negative for chest pain, palpitations, orthopnea, claudication, leg swelling and PND.  Gastrointestinal: Negative for heartburn, nausea, vomiting, abdominal pain, diarrhea, constipation, blood in stool and melena.  Genitourinary: Negative for  dysuria, urgency, frequency, hematuria and flank pain.  Musculoskeletal: Negative for myalgias, back pain, joint pain, falls and neck pain.  Skin: Negative for itching and rash.  Neurological: Positive for speech change and seizures. Negative for dizziness, tingling, tremors, sensory change, focal weakness, loss of consciousness, weakness and headaches.  Endo/Heme/Allergies: Negative for environmental allergies and polydipsia. Does not bruise/bleed easily.  Psychiatric/Behavioral: Negative for depression, suicidal ideas, hallucinations, memory loss and substance abuse. The patient is not nervous/anxious  and does not have insomnia.     Blood pressure 120/76, pulse 72, temperature 98.4 F (36.9 C), temperature source Oral, resp. rate 16, height $RemoveBe'5\' 2"'dPDFlxzfa$  (1.575 m), weight 51.71 kg (114 lb), SpO2 98 %. Physical Exam  Constitutional: She is oriented to person, place, and time. She appears well-developed and well-nourished.  HENT:  Head: Normocephalic and atraumatic.  Eyes: Conjunctivae and EOM are normal. Pupils are equal, round, and reactive to light.  Neck: Normal range of motion. Neck supple. No JVD present. No tracheal deviation present. No thyromegaly present.  Cardiovascular: Normal rate and regular rhythm.  Exam reveals no gallop and no friction rub.   No murmur heard. Respiratory: Effort normal and breath sounds normal. No stridor. No respiratory distress. She has no wheezes. She has no rales. She exhibits no tenderness.  GI: Soft. Bowel sounds are normal. She exhibits no distension. There is no tenderness. There is no rebound and no guarding.  Musculoskeletal: Normal range of motion. She exhibits no edema or tenderness.  Lymphadenopathy:    She has no cervical adenopathy.  Neurological: She is alert and oriented to person, place, and time. She has normal reflexes. She displays normal reflexes. No cranial nerve deficit. She exhibits normal muscle tone. Coordination normal.  Reflexes 2+  symmetric, diffuse with downgoing toes bil  Skin: Skin is warm and dry. No rash noted. No erythema. No pallor.  +tatoo  Psychiatric: She has a normal mood and affect. Her behavior is normal. Judgment and thought content normal.     Assessment/Plan Syncope Tele Check carotid ultrasound, cardiac echo Cycle cardiac markers  Seizure  Check MRI brain withoutand with contrast EEG Appreciate neurology input regarding management and any adjustment of medication  Double vision resolved Anticholinesterase ab    Jani Gravel 09/17/2014, 1:29 AM

## 2014-09-17 NOTE — Discharge Summary (Signed)
Discharge Summary  Savannah Golden UJW:[id number] DOB: 04/08/1971  PCP: Darliss CheneyMichael Hussey, [place] Admit date: 09/16/2014 Discharge date: 09/17/2014  Time spent: 25 minutes  Recommendations for Outpatient Follow-up:  1. Medication change: Lamictal being increased to 200 mg in the morning/150 mg in the evening   Discharge Diagnoses:  Active Hospital Problems   Diagnosis Date Noted  . Seizures 09/17/2014  . Syncope 09/17/2014  . Seizure   . Paresthesia 09/16/2014    Resolved Hospital Problems   Diagnosis Date Noted Date Resolved  No resolved problems to display.    Discharge Condition: Improved, being discharged home  Diet recommendation: Regular  Filed Weights   09/16/14 1810 09/17/14 0100  Weight: 51.71 kg (114 lb) 53.524 kg (118 lb)    History of present illness:  43 year old female with past mental history of seizure presented to the emergency room on 12/21 night after she collapsed and had reports of generalized trembling with secondary blurred vision. In the emergency room, MRI was unrevealing. Patient was brought into the hospital service for evaluation of syncope and seizure  Hospital Course:  Active Problems:   Paresthesia: Improved on the ROM   Seizures: Unclear etiology. The EEG was unrevealing as discussed with neurology. It seemed her paresthesias maybe more organic and not related to a TIA or CVA. Neurology recommended increasing the Lamictal to 200 mg in the morning/150 mg in the evening which was previously 150 twice a day. I felt that this may be more conversion from stress related issues   Syncope: Echo and Dopplers unrevealing., Patient not orthostatic. Again this may be not a true syncopal episode and may be more related to conversion from stress    Procedures:  Echo done 12/22: Normal  Dopplers done 12/22: Prelim report normal  EEG: Preliminary report normal  Consultations:  Neurology  Discharge Exam: BP 109/58 mmHg  Pulse 63  Temp(Src) 98.2  F (36.8 C) (Oral)  Resp 17  Ht 5\' 2"  (1.575 m)  Wt 53.524 kg (118 lb)  BMI 21.58 kg/m2  SpO2 100%  General: Alert and oriented 3, no acute distress Cardiovascular: Regular rate and rhythm, S1-S2 Respiratory: Clear to auscultation bilaterally  Discharge Instructions You were cared for by a hospitalist during your hospital stay. If you have any questions about your discharge medications or the care you received while you were in the hospital after you are discharged, you can call the unit and asked to speak with the hospitalist on call if the hospitalist that took care of you is not available. Once you are discharged, your primary care physician will handle any further medical issues. Please note that NO REFILLS for any discharge medications will be authorized once you are discharged, as it is imperative that you return to your primary care physician (or establish a relationship with a primary care physician if you do not have one) for your aftercare needs so that they can reassess your need for medications and monitor your lab values.     Medication List    TAKE these medications        lamoTRIgine 100 MG tablet  Commonly known as:  LAMICTAL  200 mg (2 tabs in the morning) & 150mg  in the evening       No Known Allergies    The results of significant diagnostics from this hospitalization (including imaging, microbiology, ancillary and laboratory) are listed below for reference.    Significant Diagnostic Studies: Dg Chest 2 View  09/16/2014   CLINICAL DATA:  Syncopal  episode with fall and unresponsiveness last night. Subsequent possible seizure activity. Confusion. Initial encounter.  EXAM: CHEST  2 VIEW  COMPARISON:  Radiographs 08/07/2014.  FINDINGS: The heart size and mediastinal contours are normal. The lungs are clear. There is no pleural effusion or pneumothorax. No acute osseous findings are identified.  IMPRESSION: No active cardiopulmonary process.   Electronically Signed    By: Roxy Horseman M.D.   On: 09/16/2014 20:59   Ct Head Wo Contrast  09/16/2014   CLINICAL DATA:  Initial evaluation for numbness.  EXAM: CT HEAD WITHOUT CONTRAST  TECHNIQUE: Contiguous axial images were obtained from the base of the skull through the vertex without intravenous contrast.  COMPARISON:  Prior CT from 01/09/2013  FINDINGS: There is no acute intracranial hemorrhage or infarct. No mass lesion or midline shift. Gray-white matter differentiation is well maintained. Ventricles are normal in size without evidence of hydrocephalus. CSF containing spaces are within normal limits. No extra-axial fluid collection.  The calvarium is intact.  Orbital soft tissues are within normal limits.  The paranasal sinuses and mastoid air cells are well pneumatized and free of fluid.  Scalp soft tissues are unremarkable.  IMPRESSION: Normal head CT with no acute intracranial process identified.   Electronically Signed   By: Rise Mu M.D.   On: 09/16/2014 21:06   Mr Brain Wo Contrast  09/17/2014   CLINICAL DATA:  History of seizures, syncopal episode 2 days ago with seizure. Change in speech.  EXAM: MRI HEAD WITHOUT CONTRAST  TECHNIQUE: Multiplanar, multiecho pulse sequences of the brain and surrounding structures were obtained without intravenous contrast.  COMPARISON:  CT of the head September 16, 2014 and MRI of the brain August 19, 2012.  FINDINGS: The ventricles and sulci are normal. No abnormal parenchymal signal, mass lesions or mass of affect. Small area reduced diffusion within LEFT superior cerebellum only present on the coronal images, no ADC abnormality. No susceptibility artifact to suggest hemorrhage. Symmetric appearance of the hippocampi, with normal size, morphology and signal characteristics.  No abnormal extra-axial fluid collection. No abnormal calvarial bone marrow signal. Ocular globes and orbital contents are unremarkable though not tailored for evaluation. Visualized paranasal  sinuses and mastoid air cells appear well-aerated. No abnormal sellar expansion. Craniocervical junction is maintained.  IMPRESSION: Small focus of reduced diffusion within LEFT superior cerebellum seen only on the coronal DWI, equivocal for acute ischemia, this may reflect artifact from vessel/skullbase interface.   Electronically Signed   By: Awilda Metro   On: 09/17/2014 03:47    Microbiology: No results found for this or any previous visit (from the past 240 hour(s)).   Labs: Basic Metabolic Panel:  Recent Labs Lab 09/16/14 1853  NA 143  K 3.5*  CL 103  CO2 27  GLUCOSE 101*  BUN 12  CREATININE 0.80  CALCIUM 9.6   Liver Function Tests:  Recent Labs Lab 09/16/14 1853  AST 13  ALT 10  ALKPHOS 73  BILITOT 0.3  PROT 6.6  ALBUMIN 3.9   No results for input(s): LIPASE, AMYLASE in the last 168 hours. No results for input(s): AMMONIA in the last 168 hours. CBC:  Recent Labs Lab 09/16/14 1853  WBC 7.2  NEUTROABS 4.1  HGB 14.9  HCT 43.3  MCV 93.7  PLT 207   Cardiac Enzymes:  Recent Labs Lab 09/17/14 0755 09/17/14 1337  TROPONINI <0.03 <0.03   BNP: BNP (last 3 results) No results for input(s): PROBNP in the last 8760 hours. CBG: No results for  input(s): GLUCAP in the last 168 hours.     Signed:  Hollice EspyKRISHNAN,Keary Waterson K  [facility] 09/17/2014, 4:56 PM

## 2014-09-17 NOTE — Progress Notes (Signed)
Nutrition Brief Note  Patient identified on the Malnutrition Screening Tool (MST) Report  Wt Readings from Last 15 Encounters:  09/17/14 118 lb (53.524 kg)  09/17/14 118 lb (53.524 kg)  10/11/11 145 lb (65.772 kg)    Body mass index is 21.58 kg/(m^2). Patient meets criteria for normal based on current BMI. Pt's weight has been stable at around ~120 lbs.   Current diet order is regular. Currently no recorded percent meal completion on doc flowsheet, however husband reports pt is currently eating well with no difficulties. Labs and medications reviewed.   No nutrition interventions warranted at this time. If nutrition issues arise, please consult RD.   Marijean NiemannStephanie La, MS, RD, LDN Pager # [phone] After hours/ weekend pager # [phone]

## 2014-09-17 NOTE — Progress Notes (Signed)
UR completed 

## 2014-09-17 NOTE — Progress Notes (Addendum)
Patient asked about leaving AMA. Charge nurse spoke with patient, MD paged. New orders placed. Vascular paged to complete final testing.

## 2014-09-17 NOTE — Consult Note (Signed)
Consult Reason for Consult:speech difficulty, paresthesias, breakthrough seizure Referring Physician: Dr Selena BattenKim  CC: seizure and speech difficulty  HPI: Savannah DenmarkDebra Golden is an 43 y.o. female with a history of seizures who presents to the Emergency Department complaining of difficulty with speech onset and left sided paresthesias. Notes having an episode of LOC where she slumped to the ground, aroused quickly and then shortly after had another episode of LOC with generalized "tremors" Husband states the movements where not her typical seizure. After the event she appeared very confused, speech noted to be slurred and very slow. Had an abnormal "high stepping" gait. Also noted numbness/paresthesias on the left side of her face. Symptoms slowly resolved over time. She reports that she still has a "lisp" and left facial numbness.  Reports being diagnosed with seizures around 2 years ago. Had MRI and EEG. She states EEG showed right frontal slowing. Currently taking lamictal. Has been on same dose for around 1.5 years. No recent fevers, illness.    Past Medical History  Diagnosis Date  . Seizures   . TIA (transient ischemic attack)     Past Surgical History  Procedure Laterality Date  . Appendectomy    . Breast surgery    . Abdominal hysterectomy      No family history on file.  Social History:  reports that she has been smoking.  She does not have any smokeless tobacco history on file. She reports that she does not drink alcohol or use illicit drugs.  No Known Allergies  Medications:  Scheduled: . aspirin EC  325 mg Oral Daily  . lamoTRIgine  100 mg Oral TID  . potassium chloride  20 mEq Oral Once  . sodium chloride  3 mL Intravenous Q12H    CT head imaging reviewed and unremarkable. ROS: Out of a complete 14 system review, the patient complains of only the following symptoms, and all other reviewed systems are negative. +paresthesias, speech difficulty Physical Examination: Filed  Vitals:   09/16/14 2230  BP: 120/76  Pulse: 72  Temp: 98.4 F (36.9 C)  Resp: 16   Physical Exam  Constitutional: He appears well-developed and well-nourished.  Psych: Affect appropriate to situation Eyes: No scleral injection HENT: No OP obstrucion Head: Normocephalic.  Cardiovascular: Normal rate and regular rhythm.  Respiratory: Effort normal and breath sounds normal.  GI: Soft. Bowel sounds are normal. No distension. There is no tenderness.  Skin: WDI  Neurologic Examination Mental Status: Alert, oriented, thought content appropriate.  Speech fluent without evidence of aphasia. Mild intermittent dysarthria. Able to follow 3 step commands without difficulty. Cranial Nerves: II: funduscopic exam wnl bilaterally, visual fields grossly normal, pupils equal, round, reactive to light and accommodation III,IV, VI: ptosis not present, extra-ocular motions intact bilaterally V,VII: smile symmetric,decreased LT on left side in non-dermatomal distribution VIII: hearing normal bilaterally IX,X: gag reflex present XI: trapezius strength/neck flexion strength normal bilaterally XII: tongue strength normal  Motor: 5/5 strength on right side UE and LE. 5/5 strength on left side with effort dependent weakness Tone and bulk:normal tone throughout; no atrophy noted Sensory: subjective decreased LT on left side UE and LE Deep Tendon Reflexes: 2+ and symmetric throughout Plantars: Right: downgoing   Left: downgoing Cerebellar: normal finger-to-nose, normal rapid alternating movements and normal heel-to-shin test Gait: did not test  Laboratory Studies:   Basic Metabolic Panel:  Recent Labs Lab 09/16/14 1853  NA 143  K 3.5*  CL 103  CO2 27  GLUCOSE 101*  BUN 12  CREATININE  0.80  CALCIUM 9.6    Liver Function Tests:  Recent Labs Lab 09/16/14 1853  AST 13  ALT 10  ALKPHOS 73  BILITOT 0.3  PROT 6.6  ALBUMIN 3.9   No results for input(s): LIPASE, AMYLASE in the last  168 hours. No results for input(s): AMMONIA in the last 168 hours.  CBC:  Recent Labs Lab 09/16/14 1853  WBC 7.2  NEUTROABS 4.1  HGB 14.9  HCT 43.3  MCV 93.7  PLT 207    Cardiac Enzymes: No results for input(s): CKTOTAL, CKMB, CKMBINDEX, TROPONINI in the last 168 hours.  BNP: Invalid input(s): POCBNP  CBG: No results for input(s): GLUCAP in the last 168 hours.  Microbiology: No results found for this or any previous visit.  Coagulation Studies: No results for input(s): LABPROT, INR in the last 72 hours.  Urinalysis:  Recent Labs Lab 09/16/14 2008  COLORURINE YELLOW  LABSPEC 1.026  PHURINE 6.0  GLUCOSEU NEGATIVE  HGBUR NEGATIVE  BILIRUBINUR SMALL*  KETONESUR 15*  PROTEINUR NEGATIVE  UROBILINOGEN 1.0  NITRITE NEGATIVE  LEUKOCYTESUR NEGATIVE    Lipid Panel:  No results found for: CHOL, TRIG, HDL, CHOLHDL, VLDL, LDLCALC  HgbA1C: No results found for: HGBA1C  Urine Drug Screen:  No results found for: LABOPIA, COCAINSCRNUR, LABBENZ, AMPHETMU, THCU, LABBARB  Alcohol Level: No results for input(s): ETH in the last 168 hours.  Other results:  Imaging: Dg Chest 2 View  09/16/2014   CLINICAL DATA:  Syncopal episode with fall and unresponsiveness last night. Subsequent possible seizure activity. Confusion. Initial encounter.  EXAM: CHEST  2 VIEW  COMPARISON:  Radiographs 08/07/2014.  FINDINGS: The heart size and mediastinal contours are normal. The lungs are clear. There is no pleural effusion or pneumothorax. No acute osseous findings are identified.  IMPRESSION: No active cardiopulmonary process.   Electronically Signed   By: Roxy HorsemanBill  Veazey M.D.   On: 09/16/2014 20:59   Ct Head Wo Contrast  09/16/2014   CLINICAL DATA:  Initial evaluation for numbness.  EXAM: CT HEAD WITHOUT CONTRAST  TECHNIQUE: Contiguous axial images were obtained from the base of the skull through the vertex without intravenous contrast.  COMPARISON:  Prior CT from 01/09/2013  FINDINGS: There is  no acute intracranial hemorrhage or infarct. No mass lesion or midline shift. Gray-white matter differentiation is well maintained. Ventricles are normal in size without evidence of hydrocephalus. CSF containing spaces are within normal limits. No extra-axial fluid collection.  The calvarium is intact.  Orbital soft tissues are within normal limits.  The paranasal sinuses and mastoid air cells are well pneumatized and free of fluid.  Scalp soft tissues are unremarkable.  IMPRESSION: Normal head CT with no acute intracranial process identified.   Electronically Signed   By: Rise MuBenjamin  McClintock M.D.   On: 09/16/2014 21:06     Assessment/Plan:  43y/o woman with history of seizures presenting with breakthrough seizure and prolonged period of speech difficulty and subjective sensory changes after the event. Unclear etiology of current symptoms. Could potentially represent a prolonged post-ictal state. Unilateral nature of symptoms along with reported history of prior TIA raises question of an ischemic process.   -check MRI brain -check EEG -will hold on making any changes to lamictal at this time   Elspeth Choeter Janene Yousuf, DO [facility] [phone]  If 7pm- 7am, please page neurology on call as listed in AMION. 09/17/2014, 1:21 AM

## 2014-09-17 NOTE — Progress Notes (Signed)
  Echocardiogram 2D Echocardiogram has been performed.  Dashon Mcintire FRANCES 09/17/2014, 11:43 AM

## 2014-09-17 NOTE — Discharge Instructions (Signed)

## 2014-09-17 NOTE — Progress Notes (Signed)
EEG Completed; Results Pending  

## 2014-09-17 NOTE — Progress Notes (Signed)
Subjective: Feels much better  She has tremor in her right arm, but states that this is long-standing for years and no different than it ever has been.  Exam: Filed Vitals:   09/17/14 0500  BP: 118/73  Pulse: 78  Temp: 98.4 F (36.9 C)  Resp: 16   Gen: In bed, NAD MS: awake, alert.  ZO:XWRUECN:PERRL, EOMI Motor: when she is holding her arms outstretched she has a positional tremor of her right arm. This continues when she is performing finger-nose-finger however it is modulated with opening and closing of her left hand. She has a downward drift of her left arm without pronation when held faceup. When held with fingers extended out straight with distracting maneuvers, she has no drift of either arm. She has give way weakness of her left arm. Sensory: Mildly decreased on the left   Impression: 43 year old female with seizure disorder managed on lamotrigine with breakthrough seizure followed by left-sided numbness and weakness. There are multiple findings on exam which I would find concerning for a nonorganic etiology including inconsistency and modification of tremor with distraction. Her MRI results I feel represent artifact and would not be consistent with any of her symptoms.  Given the breakthrough seizure, it may be reasonable to increase her lamotrigine slightly.  Recommendations: 1) lamotrigine 200 mg every morning and 150 mg every evening 2) no further testing needed at this time. Neurology will sign off, please call with any further questions or concerns.  Ritta SlotMcNeill Davaris Youtsey, MD [facility] [phone]  If 7pm- 7am, please page neurology on call as listed in AMION.

## 2014-12-02 ENCOUNTER — Emergency Department (HOSPITAL_BASED_OUTPATIENT_CLINIC_OR_DEPARTMENT_OTHER): Payer: Self-pay

## 2014-12-02 ENCOUNTER — Emergency Department (HOSPITAL_BASED_OUTPATIENT_CLINIC_OR_DEPARTMENT_OTHER)
Admission: EM | Admit: 2014-12-02 | Discharge: 2014-12-02 | Disposition: A | Discharge status: Home / Self Care | Payer: Self-pay | Attending: Emergency Medicine | Admitting: Emergency Medicine

## 2014-12-02 ENCOUNTER — Encounter (HOSPITAL_BASED_OUTPATIENT_CLINIC_OR_DEPARTMENT_OTHER): Payer: Self-pay | Admitting: *Deleted

## 2014-12-02 DIAGNOSIS — M542 Cervicalgia: Secondary | ICD-10-CM | POA: Insufficient documentation

## 2014-12-02 DIAGNOSIS — Z720 Tobacco use: Secondary | ICD-10-CM | POA: Insufficient documentation

## 2014-12-02 DIAGNOSIS — Z79899 Other long term (current) drug therapy: Secondary | ICD-10-CM | POA: Insufficient documentation

## 2014-12-02 DIAGNOSIS — R110 Nausea: Secondary | ICD-10-CM | POA: Insufficient documentation

## 2014-12-02 DIAGNOSIS — K219 Gastro-esophageal reflux disease without esophagitis: Secondary | ICD-10-CM | POA: Insufficient documentation

## 2014-12-02 DIAGNOSIS — G40909 Epilepsy, unspecified, not intractable, without status epilepticus: Secondary | ICD-10-CM | POA: Insufficient documentation

## 2014-12-02 DIAGNOSIS — Z8673 Personal history of transient ischemic attack (TIA), and cerebral infarction without residual deficits: Secondary | ICD-10-CM | POA: Insufficient documentation

## 2014-12-02 DIAGNOSIS — J0100 Acute maxillary sinusitis, unspecified: Secondary | ICD-10-CM | POA: Insufficient documentation

## 2014-12-02 LAB — RAPID STREP SCREEN (MED CTR MEBANE ONLY): STREPTOCOCCUS, GROUP A SCREEN (DIRECT): NEGATIVE

## 2014-12-02 MED ORDER — KETOROLAC TROMETHAMINE 60 MG/2ML IM SOLN
60.0000 mg | Freq: Once | INTRAMUSCULAR | Status: AC
  Administered 2014-12-02: 60 mg via INTRAMUSCULAR
  Filled 2014-12-02: qty 2

## 2014-12-02 MED ORDER — ACETAMINOPHEN 325 MG PO TABS
650.0000 mg | ORAL_TABLET | Freq: Once | ORAL | Status: AC
  Administered 2014-12-02: 650 mg via ORAL
  Filled 2014-12-02: qty 2

## 2014-12-02 MED ORDER — DOXYCYCLINE HYCLATE 100 MG PO CAPS: 100.0000 mg | ORAL_CAPSULE | Freq: Two times a day (BID) | ORAL | Status: DC

## 2014-12-02 MED ORDER — PROCHLORPERAZINE MALEATE 10 MG PO TABS
10.0000 mg | ORAL_TABLET | Freq: Once | ORAL | Status: AC
  Administered 2014-12-02: 10 mg via ORAL
  Filled 2014-12-02: qty 1

## 2014-12-02 NOTE — ED Notes (Signed)
Pt c/o cold symptoms, congestion, fever, cough, and neck pain x5 days.

## 2014-12-02 NOTE — ED Notes (Signed)
[facility], NAD, calm, interactive. Pt updated. pending strep results. C/o HA.

## 2014-12-02 NOTE — ED Notes (Signed)
Patient transported to X-ray 

## 2014-12-02 NOTE — Discharge Instructions (Signed)
Sinusitis °Sinusitis is redness, soreness, and inflammation of the paranasal sinuses. Paranasal sinuses are air pockets within the bones of your face (beneath the eyes, the middle of the forehead, or above the eyes). In healthy paranasal sinuses, mucus is able to drain out, and air is able to circulate through them by way of your nose. However, when your paranasal sinuses are inflamed, mucus and air can become trapped. This can allow bacteria and other germs to grow and cause infection. °Sinusitis can develop quickly and last only a short time (acute) or continue over a long period (chronic). Sinusitis that lasts for more than 12 weeks is considered chronic.  °CAUSES  °Causes of sinusitis include: °· Allergies. °· Structural abnormalities, such as displacement of the cartilage that separates your nostrils (deviated septum), which can decrease the air flow through your nose and sinuses and affect sinus drainage. °· Functional abnormalities, such as when the small hairs (cilia) that line your sinuses and help remove mucus do not work properly or are not present. °SIGNS AND SYMPTOMS  °Symptoms of acute and chronic sinusitis are the same. The primary symptoms are pain and pressure around the affected sinuses. Other symptoms include: °· Upper toothache. °· Earache. °· Headache. °· Bad breath. °· Decreased sense of smell and taste. °· A cough, which worsens when you are lying flat. °· Fatigue. °· Fever. °· Thick drainage from your nose, which often is green and may contain pus (purulent). °· Swelling and warmth over the affected sinuses. °DIAGNOSIS  °Your health care provider will perform a physical exam. During the exam, your health care provider may: °· Look in your nose for signs of abnormal growths in your nostrils (nasal polyps). °· Tap over the affected sinus to check for signs of infection. °· View the inside of your sinuses (endoscopy) using an imaging device that has a light attached (endoscope). °If your health  care provider suspects that you have chronic sinusitis, one or more of the following tests may be recommended: °· Allergy tests. °· Nasal culture. A sample of mucus is taken from your nose, sent to a lab, and screened for bacteria. °· Nasal cytology. A sample of mucus is taken from your nose and examined by your health care provider to determine if your sinusitis is related to an allergy. °TREATMENT  °Most cases of acute sinusitis are related to a viral infection and will resolve on their own within 10 days. Sometimes medicines are prescribed to help relieve symptoms (pain medicine, decongestants, nasal steroid sprays, or saline sprays).  °However, for sinusitis related to a bacterial infection, your health care provider will prescribe antibiotic medicines. These are medicines that will help kill the bacteria causing the infection.  °Rarely, sinusitis is caused by a fungal infection. In theses cases, your health care provider will prescribe antifungal medicine. °For some cases of chronic sinusitis, surgery is needed. Generally, these are cases in which sinusitis recurs more than 3 times per year, despite other treatments. °HOME CARE INSTRUCTIONS  °· Drink plenty of water. Water helps thin the mucus so your sinuses can drain more easily. °· Use a humidifier. °· Inhale steam 3 to 4 times a day (for example, sit in the bathroom with the shower running). °· Apply a warm, moist washcloth to your face 3 to 4 times a day, or as directed by your health care provider. °· Use saline nasal sprays to help moisten and clean your sinuses. °· Take medicines only as directed by your health care provider. °·   If you were prescribed either an antibiotic or antifungal medicine, finish it all even if you start to feel better. SEEK IMMEDIATE MEDICAL CARE IF:  You have increasing pain or severe headaches.  You have nausea, vomiting, or drowsiness.  You have swelling around your face.  You have vision problems.  You have a stiff  neck.  You have difficulty breathing. MAKE SURE YOU:   Understand these instructions.  Will watch your condition.  Will get help right away if you are not doing well or get worse. Document Released: 09/13/2005 Document Revised: 01/28/2014 Document Reviewed: 09/28/2011 ExitCare Patient Information 2015 [employer]. This information is not intended to replace advice given to you by your health care provider. Make sure you discuss any questions you have with your health care provider. Influenza Influenza ("the flu") is a viral infection of the respiratory tract. It occurs more often in winter months because people spend more time in close contact with one another. Influenza can make you feel very sick. Influenza easily spreads from person to person (contagious). CAUSES  Influenza is caused by a virus that infects the respiratory tract. You can catch the virus by breathing in droplets from an infected person's cough or sneeze. You can also catch the virus by touching something that was recently contaminated with the virus and then touching your mouth, nose, or eyes. RISKS AND COMPLICATIONS You may be at risk for a more severe case of influenza if you smoke cigarettes, have diabetes, have chronic heart disease (such as heart failure) or lung disease (such as asthma), or if you have a weakened immune system. Elderly people and pregnant women are also at risk for more serious infections. The most common problem of influenza is a lung infection (pneumonia). Sometimes, this problem can require emergency medical care and may be life threatening. SIGNS AND SYMPTOMS  Symptoms typically last 4 to 10 days and may include:  Fever.  Chills.  Headache, body aches, and muscle aches.  Sore throat.  Chest discomfort and cough.  Poor appetite.  Weakness or feeling tired.  Dizziness.  Nausea or vomiting. DIAGNOSIS  Diagnosis of influenza is often made based on your history and a physical exam. A  nose or throat swab test can be done to confirm the diagnosis. TREATMENT  In mild cases, influenza goes away on its own. Treatment is directed at relieving symptoms. For more severe cases, your health care provider may prescribe antiviral medicines to shorten the sickness. Antibiotic medicines are not effective because the infection is caused by a virus, not by bacteria. HOME CARE INSTRUCTIONS  Take medicines only as directed by your health care provider.  Use a cool mist humidifier to make breathing easier.  Get plenty of rest until your temperature returns to normal. This usually takes 3 to 4 days.  Drink enough fluid to keep your urine clear or pale yellow.  Cover yourmouth and nosewhen coughing or sneezing,and wash your handswellto prevent thevirusfrom spreading.  Stay homefromwork orschool untilthe fever is gonefor at least 1full day. PREVENTION  An annual influenza vaccination (flu shot) is the best way to avoid getting influenza. An annual flu shot is now routinely recommended for all adults in the [place] SEEK MEDICAL CARE IF:  You experiencechest pain, yourcough worsens,or you producemore mucus.  Youhave nausea,vomiting, ordiarrhea.  Your fever returns or gets worse. SEEK IMMEDIATE MEDICAL CARE IF:  You havetrouble breathing, you become short of breath,or your skin ornails becomebluish.  You have severe painor stiffnessin the neck.    neck.  You develop a sudden headache, or pain in the face or ear.  You have nausea or vomiting that you cannot control. MAKE SURE YOU:   Understand these instructions.  Will watch your condition.  Will get help right away if you are not doing well or get worse. Document Released: 09/10/2000 Document Revised: 01/28/2014 Document Reviewed: 12/13/2011 [facility] Patient Information 2015 [place], [place]. This information is not intended to replace advice given to you by your health care provider. Make sure you discuss any  questions you have with your health care provider.

## 2014-12-02 NOTE — ED Notes (Signed)
Pt reports sick contact of grandson since last week, when her fever started.

## 2014-12-02 NOTE — ED Notes (Signed)
Pt alert, NAD, calm, interactive[facility]

## 2014-12-02 NOTE — ED Provider Notes (Signed)
CSN: [id number]     Arrival date & time 12/02/14  1752 History   First MD Initiated Contact with Patient 12/02/14 1825     Chief Complaint  Patient presents with  . Fever     (Consider location/radiation/quality/duration/timing/severity/associated sxs/prior Treatment) HPI Savannah Golden is a 44 year old female with past medical history of seizures, TIA, GERD who presents the ER complaining of 5 days of fever, nasal congestion, facial pain, headache, neck pain, cough. Patient states her symptoms began gradually earlier in the week, and have since persisted. Patient has tried using Tylenol and ibuprofen over-the-counter for headache relief with minimal effect. Patient reports some associated queasiness but no vomiting. Patient reports associated fever up to 101.4. Patient denies blurred vision, photophobia, phonophobia, weakness, dizziness, chest pain, shortness of breath, abdominal pain, diarrhea.  Past Medical History  Diagnosis Date  . Seizures   . TIA (transient ischemic attack)   . GERD (gastroesophageal reflux disease)    Past Surgical History  Procedure Laterality Date  . Appendectomy    . Breast surgery    . Abdominal hysterectomy     Family History  Problem Relation Age of Onset  . Diabetes Father     had amputation due to diabetes   History  Substance Use Topics  . Smoking status: Current Every Day Smoker -- 1.00 packs/day for 20 years    Types: Cigarettes  . Smokeless tobacco: Not on file  . Alcohol Use: No   OB History    No data available     Review of Systems  Constitutional: Positive for fever.  HENT: Positive for sinus pressure and sore throat. Negative for trouble swallowing.   Eyes: Negative for visual disturbance.  Respiratory: Positive for cough. Negative for shortness of breath.   Cardiovascular: Negative for chest pain.  Gastrointestinal: Positive for nausea. Negative for vomiting and abdominal pain.  Genitourinary: Negative for dysuria.   Musculoskeletal: Positive for neck pain.  Skin: Negative for rash.  Neurological: Positive for headaches. Negative for dizziness, weakness and numbness.  Psychiatric/Behavioral: Negative.       Allergies  Review of patient's allergies indicates no known allergies.  Home Medications   Prior to Admission medications   Medication Sig Start Date End Date Taking? Authorizing Provider  busPIRone (BUSPAR) 10 MG tablet Take 20 mg by mouth 2 (two) times daily.   Yes Historical Provider, MD  doxycycline (VIBRAMYCIN) 100 MG capsule Take 1 capsule (100 mg total) by mouth 2 (two) times daily. One po bid x 7 days 12/02/14   Ladona Mow, PA-C  lamoTRIgine (LAMICTAL) 100 MG tablet 200 mg (2 tabs in the morning) &  in the evening 09/17/14   Hollice Espy, MD  ranitidine (ZANTAC) 150 MG tablet Take 150 mg by mouth daily.    Historical Provider, MD   BP 116/77 mmHg  Pulse 94  Temp(Src) 99.4 F (37.4 C) (Oral)  Resp 20  Ht  (1.575 m)  Wt 108 lb (48.988 kg)  BMI 19.75 kg/m2  SpO2 95% Physical Exam  Constitutional: She is oriented to person, place, and time. She appears well-developed and well-nourished. No distress.  HENT:  Head: Normocephalic and atraumatic.  Right Ear: Hearing, tympanic membrane, external ear and ear canal normal.  Left Ear: Hearing, tympanic membrane, external ear and ear canal normal.  Nose: Nose normal. Right sinus exhibits no maxillary sinus tenderness and no frontal sinus tenderness. Left sinus exhibits no maxillary sinus tenderness and no frontal sinus tenderness.  Mouth/Throat: Mucous membranes are  normal. No trismus in the jaw. No uvula swelling. Posterior oropharyngeal erythema present. No oropharyngeal exudate, posterior oropharyngeal edema or tonsillar abscesses.  Eyes: Conjunctivae and EOM are normal. Pupils are equal, round, and reactive to light. Right eye exhibits no discharge. Left eye exhibits no discharge. No scleral icterus.  Neck: Normal range of  motion. Neck supple. Muscular tenderness present. No spinous process tenderness present. No rigidity. Normal range of motion present. No Brudzinski's sign and no Kernig's sign noted.  Cardiovascular: Normal rate, regular rhythm, S1 normal, S2 normal and normal heart sounds.   No murmur heard. Pulmonary/Chest: Effort normal and breath sounds normal. No accessory muscle usage. No tachypnea. No respiratory distress.  Abdominal: Soft. Normal appearance and bowel sounds are normal. There is no tenderness. There is no rigidity, no guarding, no tenderness at McBurney's point and negative Murphy's sign.  Musculoskeletal: Normal range of motion. She exhibits no edema or tenderness.  Neurological: She is alert and oriented to person, place, and time. She has normal strength. No cranial nerve deficit or sensory deficit. Coordination normal. GCS eye subscore is 4. GCS verbal subscore is 5. GCS motor subscore is 6.  Patient fully alert, answering questions appropriately in full, clear sentences. Cranial nerves II through XII grossly intact. Motor strength 5 out of 5 in all major muscle groups of upper motions. Distal sensation intact.  Skin: Skin is warm and dry. No rash noted. She is not diaphoretic.  Psychiatric: She has a normal mood and affect.  Nursing note and vitals reviewed.   ED Course  Procedures (including critical care time) Labs Review Labs Reviewed  RAPID STREP SCREEN  CULTURE, GROUP A STREP    Imaging Review Dg Chest 2 View  12/02/2014   CLINICAL DATA:  Cough, fever, and wheezing since last Thursday.  EXAM: CHEST  2 VIEW  COMPARISON:  September 16, 2014  FINDINGS: The heart size and mediastinal contours are within normal limits. There is no focal infiltrate, pulmonary edema, or pleural effusion. The visualized skeletal structures are stable.  IMPRESSION: No active cardiopulmonary disease.   Electronically Signed   By: Sherian ReinWei-Chen  Lin M.D.   On: 12/02/2014 18:39     EKG Interpretation None       MDM   Final diagnoses:  Acute maxillary sinusitis, recurrence not specified    Chest radiographs unremarkable for acute pathology. Strep negative. No meningeal signs or concern for meningitis. HA with improvement after symptomatic therapy.    Patient complaining of symptoms of sinusitis.    Severe symptoms have been present for 5 days with worsening over 24 hrs with purulent nasal discharge and maxillary sinus pain.  Concern for acute bacterial rhinosinusitis.  Patient discharged with Doxycycline.  Instructions given for warm saline nasal wash and recommendations for follow-up with primary care physician.  Return precautions discussed, pt verbalized understanding and agreement of this plan    BP 116/77 mmHg  Pulse 94  Temp(Src) 99.4 F (37.4 C) (Oral)  Resp 20  Ht 5\' 2"  (1.575 m)  Wt 108 lb (48.988 kg)  BMI 19.75 kg/m2  SpO2 95%  Signed,  Ladona MowJoe Oletta Buehring, PA-C 2:15 PM  Patient seen and discussed with Dr. Nelva Nayobert Beaton, MD    Ladona MowJoe Kaitlinn Iversen, PA-C 12/03/14 1416  Nelva Nayobert Beaton, MD 12/06/14 1030

## 2014-12-05 LAB — CULTURE, GROUP A STREP: Strep A Culture: NEGATIVE

## 2015-11-28 IMAGING — CT CT HEAD W/O CM
1 series · 16 of 30 positions shown, 20 images · non-contrast
Comparison: Prior CT from 01/09/2013

CLINICAL DATA: Initial evaluation for numbness.

EXAM:
CT HEAD WITHOUT CONTRAST
TECHNIQUE: Contiguous axial images were obtained from the base of the skull
through the vertex without intravenous contrast.

[Series 2: head 4.8 h37s · axial · 0.46mm/px · z∈[-165,-9]mm · 16 of 36 slices shown, 20 images]
[im 2/36  brain]
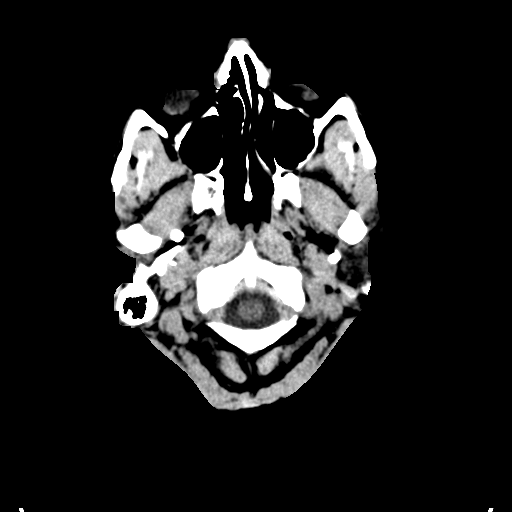
[im 2/36  bone]
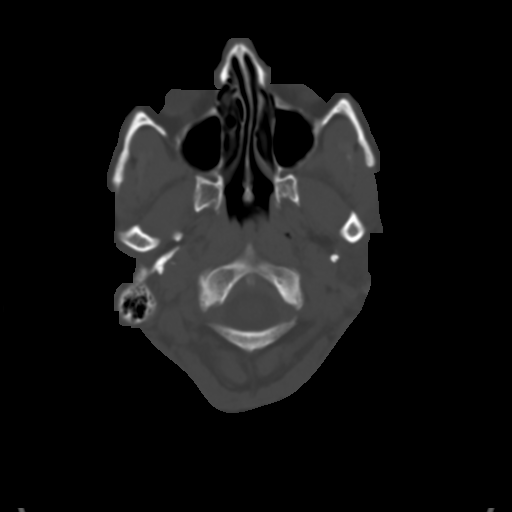
[im 4/36  brain]
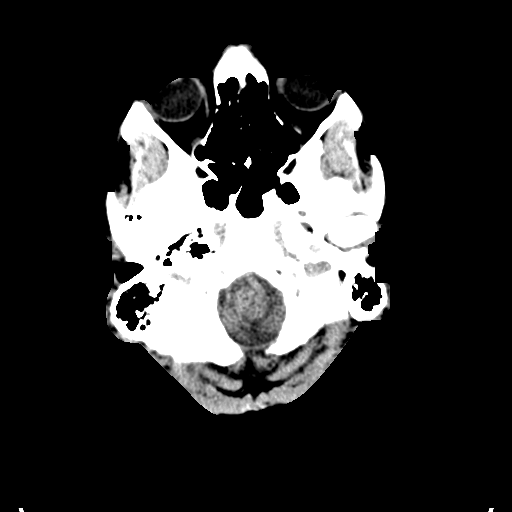
[im 7/36  brain]
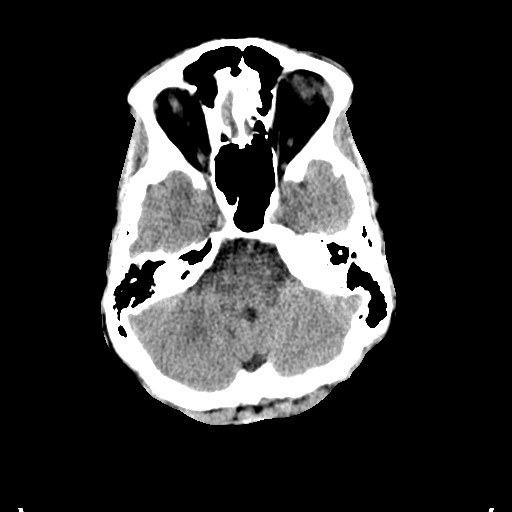
[im 9/36  brain]
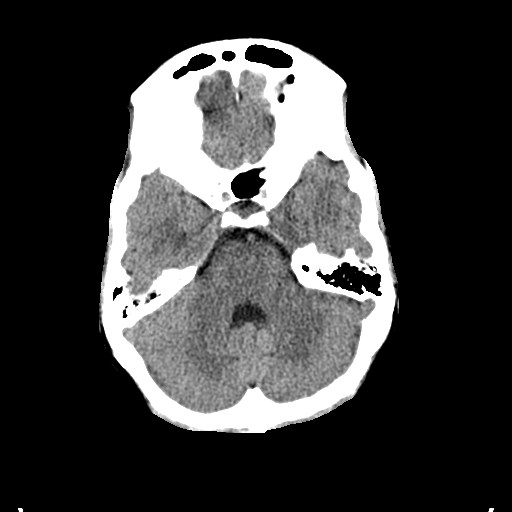
[im 10/36  brain]
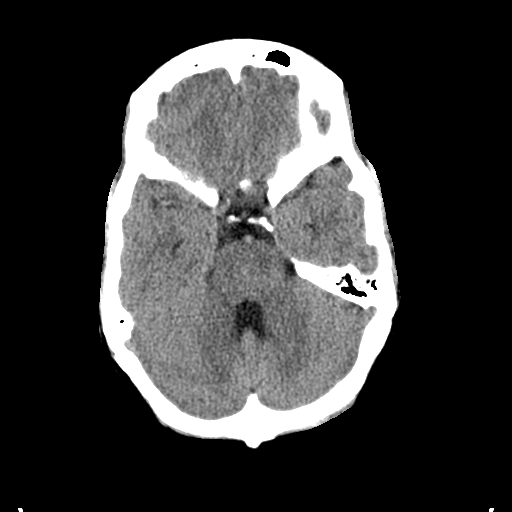
[im 10/36  bone]
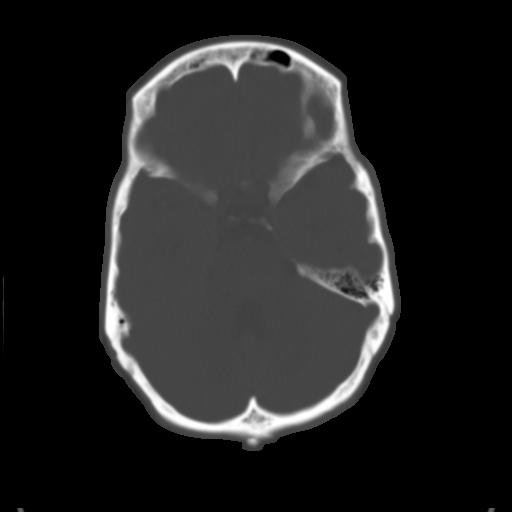
[im 13/36  brain]
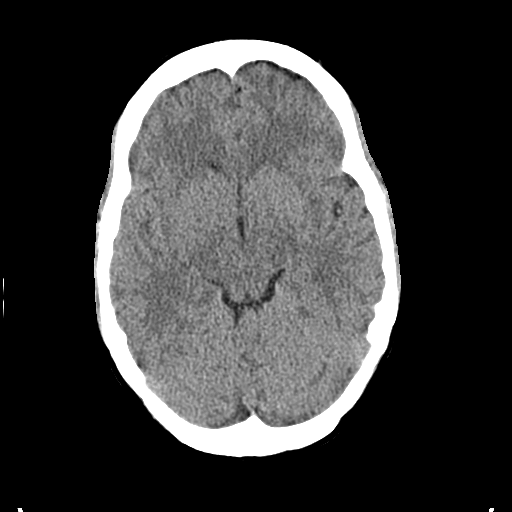
[im 15/36  brain]
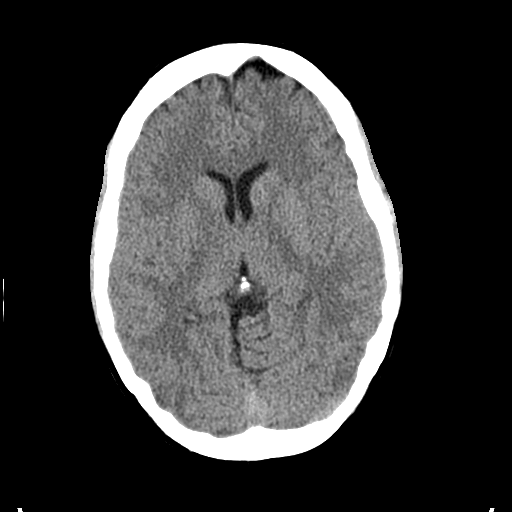
[im 17/36  brain]
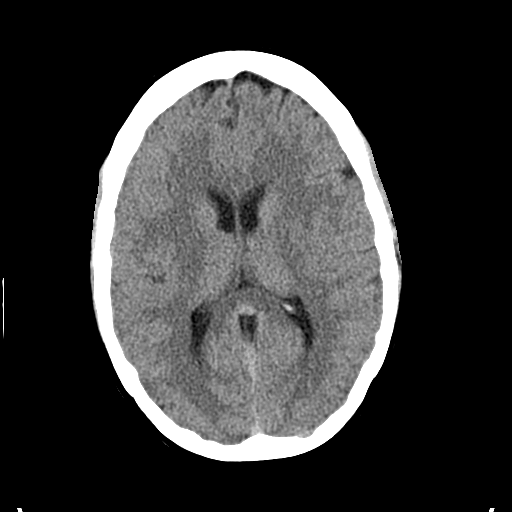
[im 19/36  brain]
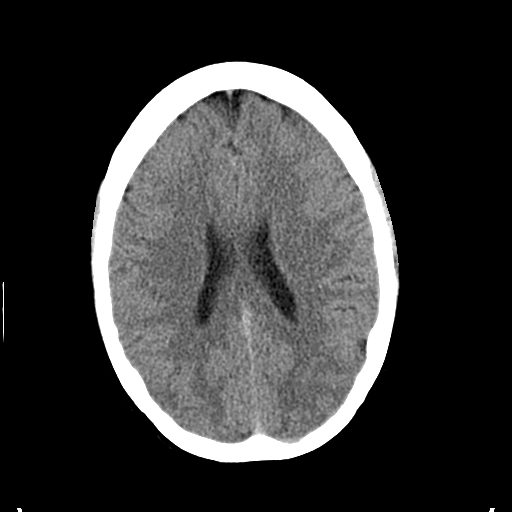
[im 19/36  bone]
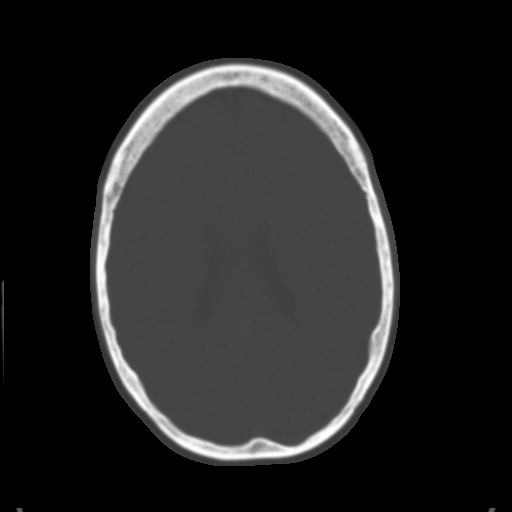
[im 21/36  brain]
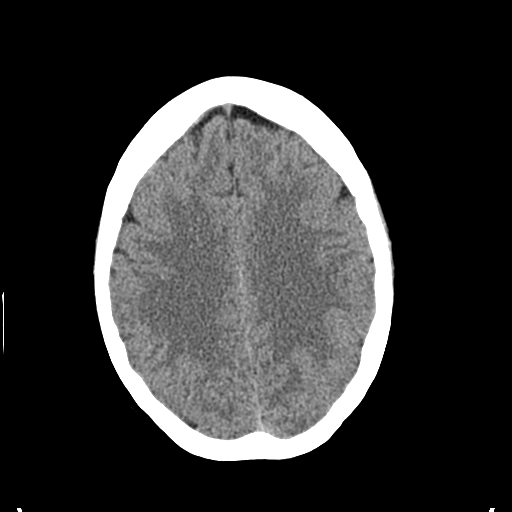
[im 23/36  brain]
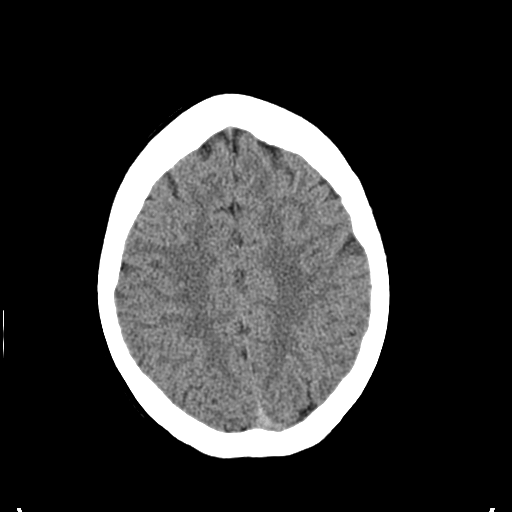
[im 26/36  brain]
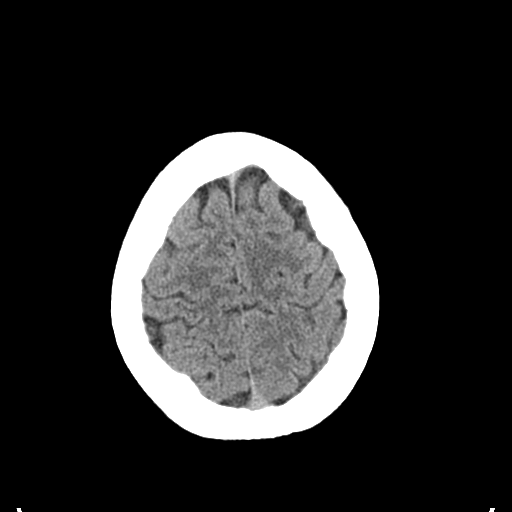
[im 27/36  brain]
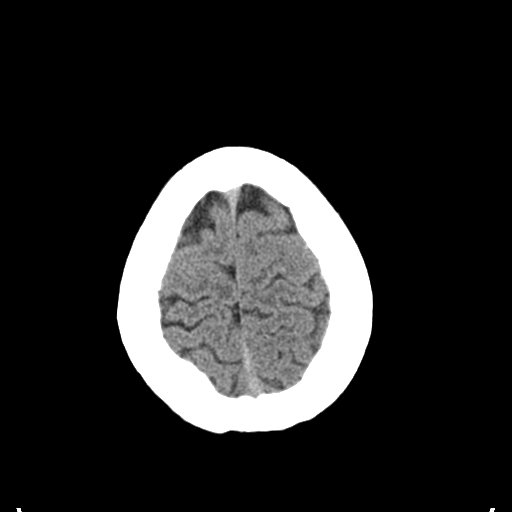
[im 27/36  bone]
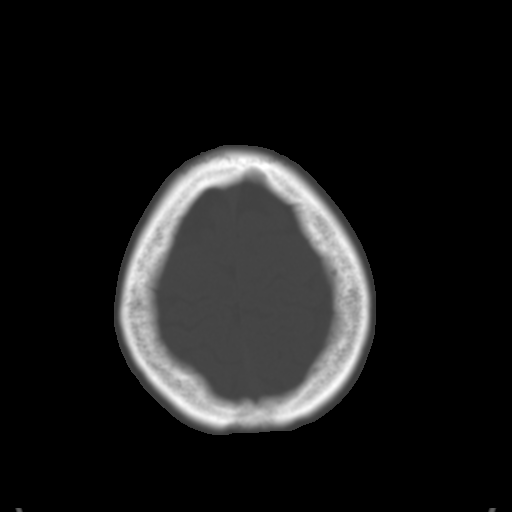
[im 29/36  brain]
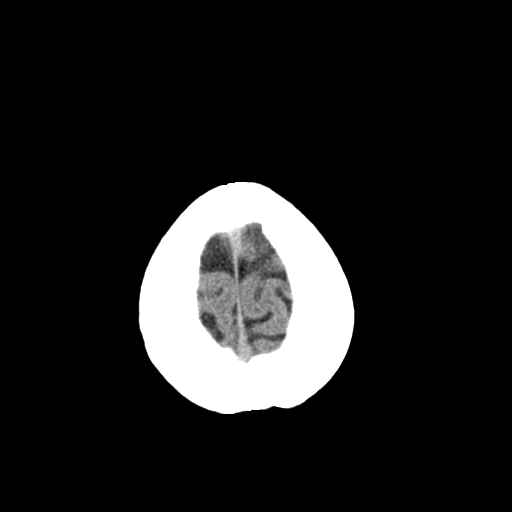
[im 32/36  brain]
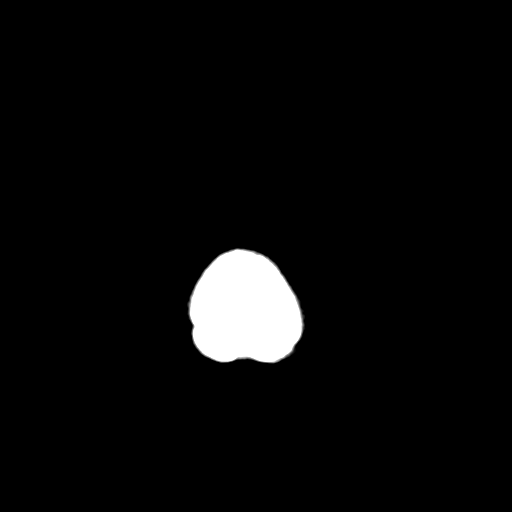
[im 34/36  brain]
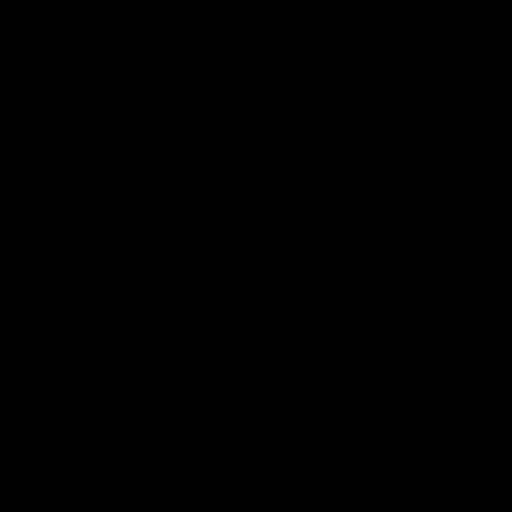

[16 of 30 positions shown; findings below may reference images not displayed]

FINDINGS: There is no acute intracranial hemorrhage or infarct. No mass lesion
or midline shift. Gray-white matter differentiation is well
maintained. Ventricles are normal in size without evidence of
hydrocephalus. CSF containing spaces are within normal limits. No
extra-axial fluid collection.

The calvarium is intact.

Orbital soft tissues are within normal limits.

The paranasal sinuses and mastoid air cells are well pneumatized and
free of fluid.

Scalp soft tissues are unremarkable.
IMPRESSION: Normal head CT with no acute intracranial process identified.

## 2018-10-25 ENCOUNTER — Ambulatory Visit: Payer: Medicaid Other | Admitting: Neurology

## 2018-10-25 ENCOUNTER — Encounter: Payer: Self-pay | Admitting: Neurology

## 2018-10-25 VITALS — BP 102/65 | HR 78 | Ht 62.0 in | Wt 127.0 lb

## 2018-10-25 DIAGNOSIS — F449 Dissociative and conversion disorder, unspecified: Secondary | ICD-10-CM | POA: Insufficient documentation

## 2018-10-25 DIAGNOSIS — G40509 Epileptic seizures related to external causes, not intractable, without status epilepticus: Secondary | ICD-10-CM | POA: Diagnosis not present

## 2018-10-25 NOTE — Patient Instructions (Signed)
° °Conversion Disorder °Conversion disorder is also known as functional neurological symptom disorder. It is a mental (psychiatric) condition in which a person shows symptoms of a problem with the brain, spinal cord, or nerves (nervous system), such as a stroke or seizure. However, these symptoms cannot be explained by a medical condition. This means that the symptoms are real, but the condition is not caused by an injury to the nervous system. °It is important to note that the symptoms are not faked. Rather, they point to a person's underlying depression, stress, or anxiety. These symptoms are often started (triggered) by a recent or past stressful event. °Conversion disorder may begin anytime from late childhood through the young adult years. This disorder is more common in females than males. °What are the causes? °The exact cause of this disorder is not known. °What increases the risk? °You are more likely to develop this condition if: °· You are in a very stressful situation. °· You have a psychiatric condition, such as depression, anxiety, or bipolar disorder. °· You have a personality disorder, such as histrionic personality disorder, borderline personality disorder, or narcissistic personality disorder. °· You have a history of childhood abuse. °What are the signs or symptoms? °Symptoms of this condition may include: °· Muscle weakness or paralysis. If this involves the bladder muscle, it can cause difficulty urinating or loss of bladder control. °· Spells of uncontrollable shaking or spells that look like seizures (pseudoseizures). °· Abnormal movements, such as tremors, jerks, muscle spasms, loss of balance, or difficulty walking. °· Difficulty swallowing, or a feeling of having a lump in the throat. °· Loss of voice (aphonia), stuttering, or a struggle with speech movements (dysarthria). °· Loss of the sensation of touch or pain. °· Changes in vision, such as blindness or double vision. °· Seeing or  hearing things that are not there (hallucinations). °· Changes in hearing, including deafness. °Symptoms usually occur suddenly, often around times of intense stress. °How is this diagnosed? °This condition is diagnosed based on an exam by your health care provider. Exams and tests will also be done to make sure you do not have serious physical health problems. These exams and tests will vary depending on your specific symptoms. They may include: °· A physical exam. This involves tests to check for problems with the brain or another part of the nervous system (neurological exam). °· Blood and urine tests. °· X-rays or other imaging studies. °· An electroencephalogram (EEG). This monitors brain waves to look for seizures. °Your health care provider may refer you to a mental health specialist for more tests. °How is this treated? °The main goal of treatment is to get the symptoms to go away as soon as possible. For most people, this condition may be treated with: °· Relaxation techniques. °· Reassurance from the health care provider that the symptoms are stress-related. °For people with symptoms that do not go away, treatment options may include: °· Counseling. Acceptance of the diagnosis is the first step toward successful treatment of the disorder. °· Medicine. Antidepressant medicine may be helpful even if a person with conversion symptoms is not depressed. °· Cognitive behavioral therapy (CBT). This involves discussing stressful life events that may have triggered the symptoms and talking about ways to cope with the stress. °· Physical therapy (PT) and occupational therapy (OT). OT can help to restore strength, and PT can improve walking if your strength and the way that you walk (gait) have been affected. °· Amobarbital interview (rare). This involves   discussing stressful life events that may have triggered the symptoms. The mental health specialist asks you questions after you take a short-acting medicine called  amobarbital. This medicine puts a person in a hypnotic state. °Follow these instructions at home: °· Take over-the-counter and prescription medicines only as told by your health care provider. °· Work with your health care provider to identify and reduce stress (use stress management). °· Keep all follow-up visits as told by your health care provider. This is important. Follow-up visits may include counseling or physical therapy sessions. °Contact a health care provider if: °· Your symptoms do not go away or they become worse. °· You develop new symptoms. °Get help right away if: °· You have serious thoughts about hurting yourself or someone else. °If you ever feel like you may hurt yourself or others, or have thoughts about taking your own life, get help right away. You can go to your nearest emergency department or call: °· Your local emergency services (911 in the [place] °· A suicide crisis helpline, such as the National Suicide Prevention Lifeline at 1-800-273-8255. This is open 24 hours a day. °Summary °· Conversion disorder is a mental (psychiatric) disorder with symptoms that wrongly suggest an injury to the brain, spinal cord, or nerves (nervous system). °· Symptoms of conversion disorder may include weakness, numbness, and spells that look like seizures. °· Treatment may involve counseling for stress management, talk therapy, medicines, and physical or occupational therapy. °· If you ever feel like you may hurt yourself or others, or have thoughts about taking your own life, get help right away. °This information is not intended to replace advice given to you by your health care provider. Make sure you discuss any questions you have with your health care provider. °Document Released: 10/16/2010 Document Revised: 07/04/2017 Document Reviewed: 07/04/2017 °Elsevier Interactive Patient Education © 2019 Elsevier Inc. ° °
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
## 2018-11-10 ENCOUNTER — Other Ambulatory Visit: Payer: Self-pay

## 2018-11-10 ENCOUNTER — Encounter (HOSPITAL_BASED_OUTPATIENT_CLINIC_OR_DEPARTMENT_OTHER): Payer: Self-pay | Admitting: Emergency Medicine

## 2018-11-10 DIAGNOSIS — F17210 Nicotine dependence, cigarettes, uncomplicated: Secondary | ICD-10-CM | POA: Insufficient documentation

## 2018-11-10 DIAGNOSIS — R509 Fever, unspecified: Secondary | ICD-10-CM | POA: Diagnosis present

## 2018-11-10 DIAGNOSIS — Z8673 Personal history of transient ischemic attack (TIA), and cerebral infarction without residual deficits: Secondary | ICD-10-CM | POA: Insufficient documentation

## 2018-11-10 DIAGNOSIS — Z79899 Other long term (current) drug therapy: Secondary | ICD-10-CM | POA: Insufficient documentation

## 2018-11-10 DIAGNOSIS — J029 Acute pharyngitis, unspecified: Secondary | ICD-10-CM | POA: Insufficient documentation

## 2018-11-10 DIAGNOSIS — R590 Localized enlarged lymph nodes: Secondary | ICD-10-CM | POA: Insufficient documentation

## 2018-11-10 NOTE — ED Triage Notes (Signed)
Pt states she started running a fever yesterday and the right side of her neck has been hurting up to her ear  Pt states when she looked at her throat she has redness and white spots on the right side

## 2018-11-11 ENCOUNTER — Emergency Department (HOSPITAL_BASED_OUTPATIENT_CLINIC_OR_DEPARTMENT_OTHER)
Admission: EM | Admit: 2018-11-11 | Discharge: 2018-11-11 | Disposition: A | Discharge status: Home / Self Care | Payer: Medicaid Other | Attending: Emergency Medicine | Admitting: Emergency Medicine

## 2018-11-11 ENCOUNTER — Encounter (HOSPITAL_BASED_OUTPATIENT_CLINIC_OR_DEPARTMENT_OTHER): Payer: Self-pay | Admitting: Emergency Medicine

## 2018-11-11 DIAGNOSIS — J029 Acute pharyngitis, unspecified: Secondary | ICD-10-CM

## 2018-11-11 DIAGNOSIS — R590 Localized enlarged lymph nodes: Secondary | ICD-10-CM

## 2018-11-11 HISTORY — DX: Major depressive disorder, single episode, unspecified: F32.9

## 2018-11-11 HISTORY — DX: Unspecified convulsions: R56.9

## 2018-11-11 HISTORY — DX: Conversion disorder with seizures or convulsions: F44.5

## 2018-11-11 HISTORY — DX: Anxiety disorder, unspecified: F41.9

## 2018-11-11 MED ORDER — CEPHALEXIN 250 MG PO CAPS
500.0000 mg | ORAL_CAPSULE | Freq: Once | ORAL | Status: AC
  Administered 2018-11-11: 500 mg via ORAL
  Filled 2018-11-11: qty 2

## 2018-11-11 MED ORDER — IBUPROFEN 400 MG PO TABS
600.0000 mg | ORAL_TABLET | Freq: Once | ORAL | Status: AC
  Administered 2018-11-11: 600 mg via ORAL
  Filled 2018-11-11: qty 1

## 2018-11-11 MED ORDER — CEPHALEXIN 500 MG PO CAPS: 500.0000 mg | ORAL_CAPSULE | Freq: Four times a day (QID) | ORAL | 0 refills | Status: DC

## 2018-11-11 NOTE — Discharge Instructions (Addendum)
Keflex as prescribed.  Ibuprofen 600 mg every 6 hours as needed for pain.  Follow-up with your primary doctor if symptoms or not improving in the next 3 to 4 days, sooner if symptoms worsen.

## 2018-11-11 NOTE — ED Provider Notes (Signed)
[facility] EMERGENCY DEPARTMENT Provider Note   CSN: [id number] Arrival date & time: 11/10/18  2303     History   Chief Complaint Chief Complaint  Patient presents with  . Fever  . Neck Pain    HPI Savannah Golden is a 48 y.o. female.  Patient is a 48 year old female with history of GERD, seizures presenting for evaluation of neck pain and sore throat.  This is been ongoing for the past 2 days.  She reports fevers and pain and swelling to the right side of her neck.  She denies any difficulty breathing.  The history is provided by the patient.  Fever  Temp source:  Subjective Severity:  Moderate Onset quality:  Sudden Duration:  2 days Progression:  Worsening Chronicity:  New Relieved by:  Nothing Worsened by:  Nothing Ineffective treatments:  Acetaminophen Associated symptoms: chills and sore throat   Neck Pain  Associated symptoms: fever     Past Medical History:  Diagnosis Date  . GERD (gastroesophageal reflux disease)   . Seizures (HCC)   . TIA (transient ischemic attack)     Patient Active Problem List   Diagnosis Date Noted  . 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 Seizures (HCC) 09/17/2014  . Syncope 09/17/2014  . Seizure (HCC)   . Paresthesia 09/16/2014    Past Surgical History:  Procedure Laterality Date  . ABDOMINAL HYSTERECTOMY    . APPENDECTOMY    . BREAST SURGERY       OB History   No obstetric history on file.      Home Medications    Prior to Admission medications   Medication Sig Start Date End Date Taking? Authorizing Provider  lithium 300 MG tablet TAKE 1 TABLET BY MOUTH TWICE DAILY FOR 30 DAYS 09/24/18   [provider]  LORazepam (ATIVAN) 1 MG tablet Take one tablet by mouth twice a day as needed for anxiety 07/28/18   [provider]  pantoprazole (PROTONIX)  20 MG tablet Take by mouth. 08/15/18   [provider]    Family History Family History  Problem Relation Age of Onset  . Hypertension Mother   . Diabetes Father        had amputation due to diabetes  . Heart attack Maternal Grandmother     Social History Social History   Tobacco Use  . Smoking status: Current Every Day Smoker    Packs/day: 1.00    Years: 20.00    Pack years: 20.00    Types: Cigarettes  . Smokeless tobacco: Never Used  Substance Use Topics  . Alcohol use: No    Alcohol/week: 0.0 standard drinks  . Drug use: Not Currently    Types: Marijuana    Comment: last used 2 wks ago     Allergies   Dexamethasone; Ketorolac; Topiramate; and Ketorolac tromethamine   Review of Systems Review of Systems  Constitutional: Positive for chills and fever.  HENT: Positive for sore throat.   Musculoskeletal: Positive for neck pain.  All other systems reviewed and are negative.    Physical Exam Updated Vital Signs BP (!) 118/97 (BP Location: Left Arm)   Pulse 82   Temp 99.2 F (37.3 C) (Oral)   Resp 18   Ht 5\' 2"  (1.575 m)   Wt 54.4 kg   SpO2 100%   BMI 21.95 kg/m   Physical Exam Vitals signs and  nursing note reviewed.  Constitutional:      General: She is not in acute distress.    Appearance: She is well-developed. She is not diaphoretic.  HENT:     Head: Normocephalic and atraumatic.     Mouth/Throat:     Mouth: Mucous membranes are moist.     Pharynx: Posterior oropharyngeal erythema present. No oropharyngeal exudate.  Neck:     Musculoskeletal: Normal range of motion and neck supple. No neck rigidity.  Cardiovascular:     Rate and Rhythm: Normal rate and regular rhythm.     Heart sounds: No murmur. No friction rub. No gallop.   Pulmonary:     Effort: Pulmonary effort is normal. No respiratory distress.     Breath sounds: Normal breath sounds. No wheezing.  Abdominal:     General: Bowel sounds are normal. There is no distension.      Palpations: Abdomen is soft.     Tenderness: There is no abdominal tenderness.  Musculoskeletal: Normal range of motion.  Lymphadenopathy:     Cervical: Cervical adenopathy present.  Skin:    General: Skin is warm and dry.  Neurological:     Mental Status: She is alert and oriented to person, place, and time.      ED Treatments / Results  Labs (all labs ordered are listed, but only abnormal results are displayed) Labs Reviewed - No data to display  EKG None  Radiology No results found.  Procedures Procedures (including critical care time)  Medications Ordered in ED Medications  cephALEXin (KEFLEX) capsule 500 mg (has no administration in time range)  ibuprofen (ADVIL,MOTRIN) tablet 600 mg (has no administration in time range)     Initial Impression / Assessment and Plan / ED Course  I have reviewed the triage vital signs and the nursing notes.  Pertinent labs & imaging results that were available during my care of the patient were reviewed by me and considered in my medical decision making (see chart for details).  Patient will be treated for cervical lymphadenopathy Keflex and anti-inflammatory medications.  To return as needed for any problems.  Final Clinical Impressions(s) / ED Diagnoses   Final diagnoses:  None    ED Discharge Orders    None       Geoffery Lyons, MD 11/11/18 [phone]

## 2020-03-22 ENCOUNTER — Encounter (HOSPITAL_BASED_OUTPATIENT_CLINIC_OR_DEPARTMENT_OTHER): Payer: Self-pay | Admitting: Emergency Medicine

## 2020-03-22 ENCOUNTER — Emergency Department (HOSPITAL_BASED_OUTPATIENT_CLINIC_OR_DEPARTMENT_OTHER)
Admission: EM | Admit: 2020-03-22 | Discharge: 2020-03-22 | Disposition: A | Discharge status: Home / Self Care | Payer: Medicaid Other | Attending: Emergency Medicine | Admitting: Emergency Medicine

## 2020-03-22 ENCOUNTER — Other Ambulatory Visit: Payer: Self-pay

## 2020-03-22 DIAGNOSIS — H00012 Hordeolum externum right lower eyelid: Secondary | ICD-10-CM | POA: Diagnosis not present

## 2020-03-22 DIAGNOSIS — F17210 Nicotine dependence, cigarettes, uncomplicated: Secondary | ICD-10-CM | POA: Insufficient documentation

## 2020-03-22 DIAGNOSIS — H5711 Ocular pain, right eye: Secondary | ICD-10-CM | POA: Diagnosis present

## 2020-03-22 DIAGNOSIS — H00022 Hordeolum internum right lower eyelid: Secondary | ICD-10-CM

## 2020-03-22 MED ORDER — PROMETHAZINE HCL 25 MG PO TABS: 25.0000 mg | ORAL_TABLET | Freq: Four times a day (QID) | ORAL | 0 refills | Status: AC | PRN

## 2020-03-22 MED ORDER — CLINDAMYCIN HCL 150 MG PO CAPS: 300.0000 mg | ORAL_CAPSULE | Freq: Three times a day (TID) | ORAL | 0 refills | Status: AC

## 2020-03-22 MED ORDER — TETRACAINE HCL 0.5 % OP SOLN
2.0000 [drp] | Freq: Once | OPHTHALMIC | Status: AC
  Administered 2020-03-22: 2 [drp] via OPHTHALMIC
  Filled 2020-03-22: qty 4

## 2020-03-22 MED ORDER — ERYTHROMYCIN 5 MG/GM OP OINT: TOPICAL_OINTMENT | OPHTHALMIC | 0 refills | Status: AC

## 2020-03-22 NOTE — ED Provider Notes (Signed)
[facility] EMERGENCY DEPARTMENT Provider Note   CSN: [id number] Arrival date & time: 03/22/20  1729     History Chief Complaint  Patient presents with  . Eye Pain    Savannah Golden is a 49 y.o. female with a past medical history significant for anxiety, GERD, major depressive disorder, pseudoseizures, and history of TIAs who presents to the ED due to right eye pain for the past 3 days.  Denies injury to eye.  Eye pain associated with worsening vision of the right eye.  Admits to lower eyelid edema and erythema.  Patient admits to pain with EOMs.  Denies fever and chills.  Patient notes pain has become so severe she started to have a right-sided headache.  Denies head injury.  Patient has tried warm compresses with no relief.  Denies photophobia.  Denies injected conjunctiva.  Denies discharge from eye.  History obtained from patient and past medical records. No interpreter used during encounter.      Past Medical History:  Diagnosis Date  . Anxiety   . GERD (gastroesophageal reflux disease)   . MDD (major depressive disorder)   . Pseudoseizures   . Seizures ([place])   . TIA (transient ischemic attack)     Patient Active Problem List   Diagnosis Date Noted  . 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 Seizures ([place]) 09/17/2014  . Syncope 09/17/2014  . Seizure ([place])   . Paresthesia 09/16/2014    Past Surgical History:  Procedure Laterality Date  . ABDOMINAL HYSTERECTOMY    . APPENDECTOMY    . BREAST SURGERY       OB History   No obstetric history on file.     Family History  Problem Relation Age of Onset  . Hypertension Mother   . Diabetes Father        had amputation due to diabetes  . Heart attack Maternal Grandmother     Social History   Tobacco Use  . Smoking status: Current Every Day Smoker    Packs/day: 1.00     Years: 20.00    Pack years: 20.00    Types: Cigarettes  . Smokeless tobacco: Never Used  Vaping Use  . Vaping Use: Never used  Substance Use Topics  . Alcohol use: No    Alcohol/week: 0.0 standard drinks  . Drug use: Not Currently    Types: Marijuana    Comment: last used 2 wks ago    Home Medications Prior to Admission medications   Medication Sig Start Date End Date Taking? Authorizing Provider  cephALEXin (KEFLEX) 500 MG capsule Take 1 capsule (500 mg total) by mouth 4 (four) times daily. 11/11/18   Veryl Speak, MD  clindamycin (CLEOCIN) 150 MG capsule Take 2 capsules (300 mg total) by mouth 3 (three) times daily for 5 days. 03/22/20 03/27/20  Suzy Bouchard, PA-C  erythromycin ophthalmic ointment Place a 1/2 inch ribbon of ointment into the lower eyelid. 03/22/20   Suzy Bouchard, PA-C  lithium 300 MG tablet TAKE 1 TABLET BY MOUTH TWICE DAILY FOR 30 DAYS 09/24/18   [provider]  LORazepam (ATIVAN) 1 MG tablet Take one tablet by mouth twice a day as needed for anxiety 07/28/18   [provider]  pantoprazole (PROTONIX) 20 MG tablet Take by mouth. 08/15/18   [provider]    Allergies    Dexamethasone, Ketorolac, Topiramate, and Ketorolac  tromethamine  Review of Systems   Review of Systems  Constitutional: Negative for chills and fever.  Eyes: Positive for pain and visual disturbance. Negative for photophobia, discharge and itching.  All other systems reviewed and are negative.   Physical Exam Updated Vital Signs BP 106/70 (BP Location: Left Arm)   Pulse 72   Temp 98.6 F (37 C) (Oral)   Resp 18   Ht 5\' 3"  (1.6 m)   Wt 49.9 kg   SpO2 98%   BMI 19.49 kg/m   Physical Exam Vitals and nursing note reviewed.  Constitutional:      General: She is not in acute distress.    Appearance: She is not ill-appearing.  HENT:     Head: Normocephalic.  Eyes:     Extraocular Movements: Extraocular movements intact.      Conjunctiva/sclera: Conjunctivae normal.     Pupils: Pupils are equal, round, and reactive to light.     Comments: Mild edema and erythema below right eye.  Normal conjunctiva.  EOMs intact.  No discharge. Small white bumps on inside of right lower eyelid.   Cardiovascular:     Rate and Rhythm: Normal rate and regular rhythm.     Pulses: Normal pulses.     Heart sounds: Normal heart sounds. No murmur heard.  No friction rub. No gallop.   Pulmonary:     Effort: Pulmonary effort is normal.     Breath sounds: Normal breath sounds.  Abdominal:     General: Abdomen is flat. There is no distension.     Palpations: Abdomen is soft.     Tenderness: There is no abdominal tenderness. There is no guarding or rebound.  Musculoskeletal:     Cervical back: Neck supple.     Comments: Able to move all 4 extremities without difficulty.  Neurological:     General: No focal deficit present.     Mental Status: She is alert.  Psychiatric:        Mood and Affect: Mood normal.        Behavior: Behavior normal.         ED Results / Procedures / Treatments   Labs (all labs ordered are listed, but only abnormal results are displayed) Labs Reviewed - No data to display  EKG None  Radiology No results found.  Procedures Procedures (including critical care time)  Medications Ordered in ED Medications  tetracaine (PONTOCAINE) 0.5 % ophthalmic solution 2 drop (2 drops Both Eyes Given 03/22/20 2016)    ED Course  I have reviewed the triage vital signs and the nursing notes.  Pertinent labs & imaging results that were available during my care of the patient were reviewed by me and considered in my medical decision making (see chart for details).    MDM Rules/Calculators/A&P                         49 year old female presents to the ED right eye pain associated with blurry vision and pain with EOMs. Denies fever and chills.  Vitals all within normal limits.  Patient is afebrile, not  tachycardic or hypoxic.  Patient in no acute distress and non-ill-appearing.  Mild edema and erythema below right eye.  EOMs intact. PERRL.  Small hordeolum on inner portion of lower right eyelid. IOP right: 15; IOP left: 12. No concern for acute glaucoma. Visual acuity in right 20/40 and left 20/35.  Suspect symptoms related to a hordeolum; however, given patient is  having mild amount of edema and erythema lower down will treat with systemic clindamycin as well as erythromycin ointment. Discussed case with Dr. Clarene Duke who agrees with assessment and plan.  Instructed patient to follow-up with PCP or eye doctor symptoms not improved within the next week. Strict ED precautions discussed with patient. Patient states understanding and agrees to plan. Patient discharged home in no acute distress and stable vitals  Final Clinical Impression(s) / ED Diagnoses Final diagnoses:  Hordeolum internum of right lower eyelid    Rx / DC Orders ED Discharge Orders         Ordered    erythromycin ophthalmic ointment     Discontinue  Reprint     03/22/20 2044    clindamycin (CLEOCIN) 150 MG capsule  3 times daily     Discontinue  Reprint     03/22/20 2044           Mannie Stabile, PA-C 03/22/20 2050    Little, Ambrose Finland, MD 03/24/20 [phone]

## 2020-03-22 NOTE — ED Notes (Signed)
Pt discharged to home. Discharge instructions have been discussed with patient and/or family members. Pt verbally acknowledges understanding d/c instructions, and endorses comprehension to checkout at registration before leaving.  °

## 2020-03-22 NOTE — ED Triage Notes (Addendum)
Pt c/o right eye pain x 3 days, denies injury. Pt also c/o blurred vision. Redness noted

## 2020-03-22 NOTE — Discharge Instructions (Addendum)
As discussed, the inner portion of your eyelid looks like a style. I am sending you home with an antibiotic ointment for your eye. Given you have a little bit of swelling and redness lower down, I am also going to send you home with an antibiotic to cover for a larger infection. Please follow-up with your PCP or eye doctor if your symptoms do not improve within the next week. Return to the ER for new or worsening symptoms.

## 2020-05-01 ENCOUNTER — Encounter (HOSPITAL_COMMUNITY)
Admission: EM | Disposition: A | Discharge status: Home / Self Care | Payer: Self-pay | Source: Home / Self Care | Attending: Emergency Medicine

## 2020-05-01 ENCOUNTER — Observation Stay (HOSPITAL_BASED_OUTPATIENT_CLINIC_OR_DEPARTMENT_OTHER)
Admission: EM | Admit: 2020-05-01 | Discharge: 2020-05-02 | Disposition: A | Discharge status: Home / Self Care | Payer: Medicaid Other | Attending: Internal Medicine | Admitting: Internal Medicine

## 2020-05-01 ENCOUNTER — Encounter (HOSPITAL_BASED_OUTPATIENT_CLINIC_OR_DEPARTMENT_OTHER): Payer: Self-pay | Admitting: *Deleted

## 2020-05-01 ENCOUNTER — Emergency Department (HOSPITAL_BASED_OUTPATIENT_CLINIC_OR_DEPARTMENT_OTHER): Payer: Medicaid Other

## 2020-05-01 ENCOUNTER — Other Ambulatory Visit: Payer: Self-pay

## 2020-05-01 DIAGNOSIS — F17210 Nicotine dependence, cigarettes, uncomplicated: Secondary | ICD-10-CM | POA: Diagnosis not present

## 2020-05-01 DIAGNOSIS — W5501XA Bitten by cat, initial encounter: Secondary | ICD-10-CM | POA: Diagnosis not present

## 2020-05-01 DIAGNOSIS — S61451A Open bite of right hand, initial encounter: Principal | ICD-10-CM | POA: Insufficient documentation

## 2020-05-01 DIAGNOSIS — Y999 Unspecified external cause status: Secondary | ICD-10-CM | POA: Diagnosis not present

## 2020-05-01 DIAGNOSIS — L02511 Cutaneous abscess of right hand: Secondary | ICD-10-CM | POA: Diagnosis not present

## 2020-05-01 DIAGNOSIS — Y929 Unspecified place or not applicable: Secondary | ICD-10-CM | POA: Insufficient documentation

## 2020-05-01 DIAGNOSIS — L03113 Cellulitis of right upper limb: Secondary | ICD-10-CM | POA: Diagnosis not present

## 2020-05-01 DIAGNOSIS — Z20822 Contact with and (suspected) exposure to covid-19: Secondary | ICD-10-CM | POA: Insufficient documentation

## 2020-05-01 DIAGNOSIS — G40909 Epilepsy, unspecified, not intractable, without status epilepticus: Secondary | ICD-10-CM | POA: Insufficient documentation

## 2020-05-01 DIAGNOSIS — Y939 Activity, unspecified: Secondary | ICD-10-CM | POA: Diagnosis not present

## 2020-05-01 DIAGNOSIS — Z8673 Personal history of transient ischemic attack (TIA), and cerebral infarction without residual deficits: Secondary | ICD-10-CM | POA: Insufficient documentation

## 2020-05-01 DIAGNOSIS — Z79899 Other long term (current) drug therapy: Secondary | ICD-10-CM | POA: Insufficient documentation

## 2020-05-01 DIAGNOSIS — R569 Unspecified convulsions: Secondary | ICD-10-CM

## 2020-05-01 HISTORY — PX: INCISION AND DRAINAGE ABSCESS: SHX5864

## 2020-05-01 LAB — CBC WITH DIFFERENTIAL/PLATELET
Abs Immature Granulocytes: 0.03 10*3/uL (ref 0.00–0.07)
Basophils Absolute: 0 10*3/uL (ref 0.0–0.1)
Basophils Relative: 1 %
Eosinophils Absolute: 0.1 10*3/uL (ref 0.0–0.5)
Eosinophils Relative: 1 %
HCT: 40 % (ref 36.0–46.0)
Hemoglobin: 13 g/dL (ref 12.0–15.0)
Immature Granulocytes: 0 %
Lymphocytes Relative: 17 %
Lymphs Abs: 1.5 10*3/uL (ref 0.7–4.0)
MCH: 31.9 pg (ref 26.0–34.0)
MCHC: 32.5 g/dL (ref 30.0–36.0)
MCV: 98 fL (ref 80.0–100.0)
Monocytes Absolute: 0.6 10*3/uL (ref 0.1–1.0)
Monocytes Relative: 7 %
Neutro Abs: 6.6 10*3/uL (ref 1.7–7.7)
Neutrophils Relative %: 74 %
Platelets: 226 10*3/uL (ref 150–400)
RBC: 4.08 MIL/uL (ref 3.87–5.11)
RDW: 13.1 % (ref 11.5–15.5)
WBC: 8.8 10*3/uL (ref 4.0–10.5)
nRBC: 0 % (ref 0.0–0.2)

## 2020-05-01 LAB — BASIC METABOLIC PANEL
Anion gap: 9 (ref 5–15)
BUN: 14 mg/dL (ref 6–20)
CO2: 27 mmol/L (ref 22–32)
Calcium: 9 mg/dL (ref 8.9–10.3)
Chloride: 103 mmol/L (ref 98–111)
Creatinine, Ser: 0.82 mg/dL (ref 0.44–1.00)
GFR calc Af Amer: >60 mL/min (ref >60)
GFR calc non Af Amer: >60 mL/min (ref >60)
Glucose, Bld: 102 mg/dL — ABNORMAL HIGH (ref 70–99)
Potassium: 4 mmol/L (ref 3.5–5.1)
Sodium: 139 mmol/L (ref 135–145)

## 2020-05-01 LAB — SARS CORONAVIRUS 2 BY RT PCR (HOSPITAL ORDER, PERFORMED IN ~~LOC~~ HOSPITAL LAB): SARS Coronavirus 2: NEGATIVE

## 2020-05-01 SURGERY — INCISION AND DRAINAGE, ABSCESS
Anesthesia: General | Site: Hand | Laterality: Right

## 2020-05-01 MED ORDER — FENTANYL CITRATE (PF) 100 MCG/2ML IJ SOLN
50.0000 ug | Freq: Once | INTRAMUSCULAR | Status: AC
  Administered 2020-05-01: 50 ug via INTRAVENOUS

## 2020-05-01 MED ORDER — FENTANYL CITRATE (PF) 100 MCG/2ML IJ SOLN
50.0000 ug | Freq: Once | INTRAMUSCULAR | Status: AC
  Administered 2020-05-01: 50 ug via INTRAVENOUS
  Filled 2020-05-01: qty 2

## 2020-05-01 MED ORDER — PIPERACILLIN-TAZOBACTAM 3.375 G IVPB 30 MIN
3.3750 g | Freq: Once | INTRAVENOUS | Status: AC
  Administered 2020-05-01: 3.375 g via INTRAVENOUS
  Filled 2020-05-01 (×2): qty 50

## 2020-05-01 MED ORDER — FENTANYL CITRATE (PF) 100 MCG/2ML IJ SOLN
INTRAMUSCULAR | Status: AC
  Filled 2020-05-01: qty 2

## 2020-05-01 MED ORDER — METOCLOPRAMIDE HCL 5 MG/ML IJ SOLN
5.0000 mg | Freq: Once | INTRAMUSCULAR | Status: AC
  Administered 2020-05-01: 5 mg via INTRAVENOUS
  Filled 2020-05-01: qty 2

## 2020-05-01 MED ORDER — ONDANSETRON HCL 4 MG/2ML IJ SOLN
4.0000 mg | Freq: Once | INTRAMUSCULAR | Status: DC
  Filled 2020-05-01: qty 2

## 2020-05-01 SURGICAL SUPPLY — 56 items
ADAPTER CATH SYR TO TUBING 38M (ADAPTER) ×3 IMPLANT
ADPR CATH LL SYR 3/32 TPR (ADAPTER) ×1
BNDG CMPR 9X4 STRL LF SNTH (GAUZE/BANDAGES/DRESSINGS)
BNDG COHESIVE 2X5 TAN STRL LF (GAUZE/BANDAGES/DRESSINGS) IMPLANT
BNDG ELASTIC 3X5.8 VLCR STR LF (GAUZE/BANDAGES/DRESSINGS) ×3 IMPLANT
BNDG ELASTIC 4X5.8 VLCR STR LF (GAUZE/BANDAGES/DRESSINGS) ×3 IMPLANT
BNDG ESMARK 4X9 LF (GAUZE/BANDAGES/DRESSINGS) IMPLANT
BNDG GAUZE ELAST 4 BULKY (GAUZE/BANDAGES/DRESSINGS) ×3 IMPLANT
CORD BIPOLAR FORCEPS 12FT (ELECTRODE) ×3 IMPLANT
COVER SURGICAL LIGHT HANDLE (MISCELLANEOUS) ×3 IMPLANT
COVER WAND RF STERILE (DRAPES) ×3 IMPLANT
CUFF TOURN SGL QUICK 18X4 (TOURNIQUET CUFF) ×2 IMPLANT
CUFF TOURN SGL QUICK 24 (TOURNIQUET CUFF)
CUFF TRNQT CYL 24X4X16.5-23 (TOURNIQUET CUFF) IMPLANT
DECANTER SPIKE VIAL GLASS SM (MISCELLANEOUS) ×3 IMPLANT
DRAIN PENROSE 1/4X12 LTX STRL (WOUND CARE) IMPLANT
DRAPE SURG 17X23 STRL (DRAPES) ×2 IMPLANT
DRSG PAD ABDOMINAL 8X10 ST (GAUZE/BANDAGES/DRESSINGS) ×6 IMPLANT
GAUZE PACKING IODOFORM 1/2 (PACKING) ×2 IMPLANT
GAUZE SPONGE 4X4 12PLY STRL (GAUZE/BANDAGES/DRESSINGS) ×3 IMPLANT
GAUZE XEROFORM 1X8 LF (GAUZE/BANDAGES/DRESSINGS) ×3 IMPLANT
GLOVE BIO SURGEON STRL SZ7.5 (GLOVE) ×3 IMPLANT
GLOVE BIOGEL PI IND STRL 8 (GLOVE) ×1 IMPLANT
GLOVE BIOGEL PI INDICATOR 8 (GLOVE) ×2
GOWN STRL REUS W/ TWL LRG LVL3 (GOWN DISPOSABLE) ×1 IMPLANT
GOWN STRL REUS W/ TWL XL LVL3 (GOWN DISPOSABLE) ×1 IMPLANT
GOWN STRL REUS W/TWL LRG LVL3 (GOWN DISPOSABLE) ×3
GOWN STRL REUS W/TWL XL LVL3 (GOWN DISPOSABLE) ×3
KIT BASIN OR (CUSTOM PROCEDURE TRAY) ×3 IMPLANT
KIT TURNOVER KIT B (KITS) ×3 IMPLANT
LOOP VESSEL MAXI BLUE (MISCELLANEOUS) IMPLANT
MANIFOLD NEPTUNE II (INSTRUMENTS) IMPLANT
NDL HYPO 25GX1X1/2 BEV (NEEDLE) IMPLANT
NDL HYPO 25X1 1.5 SAFETY (NEEDLE) IMPLANT
NEEDLE HYPO 25GX1X1/2 BEV (NEEDLE) ×3 IMPLANT
NEEDLE HYPO 25X1 1.5 SAFETY (NEEDLE) IMPLANT
NS IRRIG 1000ML POUR BTL (IV SOLUTION) ×3 IMPLANT
PACK ORTHO EXTREMITY (CUSTOM PROCEDURE TRAY) ×3 IMPLANT
PAD ARMBOARD 7.5X6 YLW CONV (MISCELLANEOUS) ×6 IMPLANT
SET CYSTO W/LG BORE CLAMP LF (SET/KITS/TRAYS/PACK) IMPLANT
SLING ARM FOAM STRAP LRG (SOFTGOODS) ×2 IMPLANT
SOL PREP POV-IOD 4OZ 10% (MISCELLANEOUS) ×6 IMPLANT
SPONGE LAP 4X18 RFD (DISPOSABLE) ×3 IMPLANT
SUT ETHILON 4 0 P 3 18 (SUTURE) IMPLANT
SUT ETHILON 4 0 PS 2 18 (SUTURE) IMPLANT
SUT MON AB 5-0 P3 18 (SUTURE) IMPLANT
SWAB COLLECTION DEVICE MRSA (MISCELLANEOUS) IMPLANT
SWAB CULTURE ESWAB REG 1ML (MISCELLANEOUS) IMPLANT
SYR 20ML LL LF (SYRINGE) ×3 IMPLANT
SYR CONTROL 10ML LL (SYRINGE) IMPLANT
TOWEL GREEN STERILE (TOWEL DISPOSABLE) ×3 IMPLANT
TUBE CONNECTING 12'X1/4 (SUCTIONS) ×1
TUBE CONNECTING 12X1/4 (SUCTIONS) ×2 IMPLANT
TUBE FEEDING ENTERAL 5FR 16IN (TUBING) IMPLANT
UNDERPAD 30X36 HEAVY ABSORB (UNDERPADS AND DIAPERS) ×3 IMPLANT
YANKAUER SUCT BULB TIP NO VENT (SUCTIONS) ×3 IMPLANT

## 2020-05-01 NOTE — Anesthesia Preprocedure Evaluation (Addendum)
Anesthesia Evaluation  Patient identified by MRN, date of birth, ID band Patient awake    Reviewed: Allergy & Precautions, NPO status , Patient's Chart, lab work & pertinent test results  History of Anesthesia Complications Negative for: history of anesthetic complications  Airway Mallampati: II  TM Distance: >3 FB Neck ROM: Full    Dental no notable dental hx. (+) Dental Advisory Given   Pulmonary Current SmokerPatient did not abstain from smoking.,    Pulmonary exam normal        Cardiovascular negative cardio ROS Normal cardiovascular exam     Neuro/Psych PSYCHIATRIC DISORDERS Anxiety Depression negative neurological ROS     GI/Hepatic Neg liver ROS, GERD  ,  Endo/Other  negative endocrine ROS  Renal/GU negative Renal ROS  negative genitourinary   Musculoskeletal negative musculoskeletal ROS (+)   Abdominal   Peds negative pediatric ROS (+)  Hematology negative hematology ROS (+)   Anesthesia Other Findings   Reproductive/Obstetrics negative OB ROS                            Anesthesia Physical Anesthesia Plan  ASA: II  Anesthesia Plan: General   Post-op Pain Management:    Induction:   PONV Risk Score and Plan: 3 and Ondansetron, Midazolam and Treatment may vary due to age or medical condition  Airway Management Planned: LMA and Oral ETT  Additional Equipment:   Intra-op Plan:   Post-operative Plan: Extubation in OR  Informed Consent: I have reviewed the patients History and Physical, chart, labs and discussed the procedure including the risks, benefits and alternatives for the proposed anesthesia with the patient or authorized representative who has indicated his/her understanding and acceptance.     Dental advisory given  Plan Discussed with: Anesthesiologist, CRNA and Surgeon  Anesthesia Plan Comments:       Anesthesia Quick Evaluation

## 2020-05-01 NOTE — ED Provider Notes (Signed)
11:29 PM Assumed care from [employer], please see their note for full history, physical and decision making until this point. In brief this is a 49 y.o. year old female who presented to the ED tonight with Animal Bite     Apparently sent here to see hand surgery? Will consult for same. Already had abx. Will likely admit to medicine.   Discussed with PA, plan for OR washout and likely discharge. No need for admit.  Discharge instructions, including strict return precautions for new or worsening symptoms, given. Patient and/or family verbalized understanding and agreement with the plan as described.   Labs, studies and imaging reviewed by myself and considered in medical decision making if ordered. Imaging interpreted by radiology.  Labs Reviewed  BASIC METABOLIC PANEL - Abnormal; Notable for the following components:      Result Value   Glucose, Bld 102 (*)    All other components within normal limits  SARS CORONAVIRUS 2 BY RT PCR (HOSPITAL ORDER, PERFORMED IN [facility])  CBC WITH DIFFERENTIAL/PLATELET    DG Hand Complete Right  Final Result      No follow-ups on file.    Marily Memos, MD 05/01/20 [phone]

## 2020-05-01 NOTE — ED Provider Notes (Signed)
[facility] EMERGENCY DEPARTMENT Provider Note   CSN: [id number] Arrival date & time: 05/01/20  1654     History Chief Complaint  Patient presents with  . Animal Bite    Savannah Golden is a 49 y.o. female who presents emergency department chief complaint of cat bite.  Patient was bitten on the right hand yesterday by a friend's cat.  Within the last 24 hours she has had significant pain, swelling.  The cat bit her predominantly on the dorsal side of the hand however she does have puncture wounds to the palmar surface as well.  She is right-hand dominant.  Within 24 hours she is having pain in the wrist and redness streaking up her right arm.  She states that the pain is 8 out of 10.  Worse with any movement of the fingers and wrist.  She denies any numbness or tingling.  The cat is not up-to-date on its vaccinations.  HPI     Past Medical History:  Diagnosis Date  . Anxiety   . GERD (gastroesophageal reflux disease)   . MDD (major depressive disorder)   . Pseudoseizures   . Seizures ([facility])   . TIA (transient ischemic attack)     Patient Active Problem List   Diagnosis Date Noted  . 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 Seizures ([facility]) 09/17/2014  . Syncope 09/17/2014  . Seizure ([facility])   . Paresthesia 09/16/2014    Past Surgical History:  Procedure Laterality Date  . ABDOMINAL HYSTERECTOMY    . APPENDECTOMY    . BREAST SURGERY       OB History   No obstetric history on file.     Family History  Problem Relation Age of Onset  . Hypertension Mother   . Diabetes Father        had amputation due to diabetes  . Heart attack Maternal Grandmother     Social History   Tobacco Use  . Smoking status: Current Every Day Smoker    Packs/day: 1.00    Years: 20.00    Pack years: 20.00    Types: Cigarettes  . Smokeless  tobacco: Never Used  Vaping Use  . Vaping Use: Never used  Substance Use Topics  . Alcohol use: No    Alcohol/week: 0.0 standard drinks  . Drug use: Not Currently    Types: Marijuana    Comment: last used 2 wks ago    Home Medications Prior to Admission medications   Medication Sig Start Date End Date Taking? Authorizing Provider  cephALEXin (KEFLEX) 500 MG capsule Take 1 capsule (500 mg total) by mouth 4 (four) times daily. 11/11/18   Geoffery Lyons, MD  erythromycin ophthalmic ointment Place a 1/2 inch ribbon of ointment into the lower eyelid. 03/22/20   Mannie Stabile, PA-C  lithium 300 MG tablet TAKE 1 TABLET BY MOUTH TWICE DAILY FOR 30 DAYS 09/24/18   [provider]  LORazepam (ATIVAN) 1 MG tablet Take one tablet by mouth twice a day as needed for anxiety 07/28/18   [provider]  pantoprazole (PROTONIX) 20 MG tablet Take by mouth. 08/15/18   [provider]  promethazine (PHENERGAN) 25 MG tablet Take 1 tablet (25 mg total) by mouth every 6 (six) hours as needed for nausea or vomiting. 03/22/20   Mannie Stabile, PA-C    Allergies    Dexamethasone, Ketorolac, Topiramate, Zofran [  ondansetron hcl], and Ketorolac tromethamine  Review of Systems   Review of Systems Ten systems reviewed and are negative for acute change, except as noted in the HPI.   Physical Exam Updated Vital Signs BP (!) 128/50   Pulse 64   Temp 98.9 F (37.2 C) (Oral)   Resp 19   Ht 5\' 3"  (1.6 m)   Wt 52.2 kg   SpO2 98%   BMI 20.39 kg/m   Physical Exam Vitals and nursing note reviewed.  Constitutional:      General: She is not in acute distress.    Appearance: She is well-developed. She is not diaphoretic.  HENT:     Head: Normocephalic and atraumatic.  Eyes:     General: No scleral icterus.    Conjunctiva/sclera: Conjunctivae normal.  Cardiovascular:     Rate and Rhythm: Normal rate and regular rhythm.     Heart sounds: Normal heart sounds. No murmur heard.    No friction rub. No gallop.   Pulmonary:     Effort: Pulmonary effort is normal. No respiratory distress.     Breath sounds: Normal breath sounds.  Abdominal:     General: Bowel sounds are normal. There is no distension.     Palpations: Abdomen is soft. There is no mass.     Tenderness: There is no abdominal tenderness. There is no guarding.  Musculoskeletal:     Cervical back: Normal range of motion.     Comments: Right hand with puncture wounds to the dorsal surface closer to the ulnar side and palmar surface of the right hand.  There is significant swelling, erythema which extends to the PIP joint of fingers 2 through 5.  She is able to wiggle her fingers and extend them.  She has significant pain with any range of motion of the right wrist.  There is swelling and erythema which extends up the ulnar side of the right forearm.  Skin:    General: Skin is warm and dry.  Neurological:     Mental Status: She is alert and oriented to person, place, and time.  Psychiatric:        Behavior: Behavior normal.         ED Results / Procedures / Treatments   Labs (all labs ordered are listed, but only abnormal results are displayed) Labs Reviewed  BASIC METABOLIC PANEL - Abnormal; Notable for the following components:      Result Value   Glucose, Bld 102 (*)    All other components within normal limits  SARS CORONAVIRUS 2 BY RT PCR (HOSPITAL ORDER, PERFORMED IN [facility])  CBC WITH DIFFERENTIAL/PLATELET    EKG None  Radiology DG Hand Complete Right  Result Date: 05/01/2020 CLINICAL DATA:  Status post cat bite. EXAM: RIGHT HAND - COMPLETE 3+ VIEW COMPARISON:  None. FINDINGS: There is no evidence of fracture or dislocation. There is no evidence of arthropathy or other focal bone abnormality. Moderate to marked severity soft tissue swelling is seen along the dorsal aspect of the right hand. IMPRESSION: Moderate to marked severity soft tissue swelling without evidence of  an acute osseous abnormality. Electronically Signed   By: 07/01/2020 M.D.   On: 05/01/2020 21:09    Procedures Procedures (including critical care time)  Medications Ordered in ED Medications  piperacillin-tazobactam (ZOSYN) IVPB 3.375 g ( Intravenous Stopped 05/01/20 2130)  fentaNYL (SUBLIMAZE) injection 50 mcg (50 mcg Intravenous Given 05/01/20 2043)  metoCLOPramide (REGLAN) injection 5 mg (5 mg Intravenous  Given 05/01/20 2121)  fentaNYL (SUBLIMAZE) injection 50 mcg (50 mcg Intravenous Given 05/01/20 2244)    ED Course  I have reviewed the triage vital signs and the nursing notes.  Pertinent labs & imaging results that were available during my care of the patient were reviewed by me and considered in my medical decision making (see chart for details).    MDM Rules/Calculators/A&P                          11:45 PM BP (!) 128/50   Pulse 64   Temp 98.9 F (37.2 C) (Oral)   Resp 19   Ht 5\' 3"  (1.6 m)   Wt 52.2 kg   SpO2 98%   BMI 20.39 kg/m  Patient here with infected cat bite of the right hand.  I ordered basic labs, Zosyn IV.  Discussed prophylaxis against rabies versus quarantine.  Patient chooses not to take the vaccinations at this time.  Images uploaded of the patient's infection.  I placed a call to discuss the case with hand surgery.   Patient's case discussed with Dr. who plans to take the patient to the operating room.  He has already posted the case in the OR.  Patient is not expected to need admission.  Likely will need washout and outpatient antibiotics with close outpatient follow-up with Dr. Merlyn Lot.  I personally ordered and reviewed the patient's labs which show no elevated white blood cell count.  BMP shows mildly elevated glucose likely of acute phase reaction.  Covid test is negative.   Final Clinical Impression(s) / ED Diagnoses Final diagnoses:  Cat bite, initial encounter    Rx / DC Orders ED Discharge Orders    None       Merlyn Lot,  PA-C 05/01/20 2345    07/01/20, MD 05/06/20 1827

## 2020-05-01 NOTE — H&P (Signed)
Savannah Golden is an 49 y.o. female.   Chief Complaint: cat bite HPI: 49 yo female states she was bitten by neighbors cat yesterday.  Has had worsening swelling, pain, erythema of right hand/wrist.  Constant throbbing pain.  States she had fever of 100 last night.  Pain mostly on dorsum of right hand/wrist.  Case discussed with Arthor Captain, [facility] and her note from 05/01/2020 reviewed. Xrays viewed and interpreted by me: 3 views right hand/wrist show no fractures, dislocations, radioopaque foreign bodies. Soft tissue swelilng Labs reviewed: WBC 8.8  Allergies:  Allergies  Allergen Reactions   Dexamethasone Anxiety    Increased seizures.     Ketorolac Anxiety   Topiramate Diarrhea, Nausea And Vomiting, Nausea Only and Other (See Comments)    GI problems    Zofran [Ondansetron Hcl] Other (See Comments)    headache   Ketorolac Tromethamine Anxiety    Past Medical History:  Diagnosis Date   Anxiety    GERD (gastroesophageal reflux disease)    MDD (major depressive disorder)    Pseudoseizures    Seizures (HCC)    TIA (transient ischemic attack)     Past Surgical History:  Procedure Laterality Date   ABDOMINAL HYSTERECTOMY     APPENDECTOMY     BREAST SURGERY      Family History: Family History  Problem Relation Age of Onset   Hypertension Mother    Diabetes Father        had amputation due to diabetes   Heart attack Maternal Grandmother     Social History:   reports that she has been smoking cigarettes. She has a 20.00 pack-year smoking history. She has never used smokeless tobacco. She reports previous drug use. Drug: Marijuana. She reports that she does not drink alcohol.  Medications: (Not in a hospital admission)   Results for orders placed or performed during the hospital encounter of 05/01/20 (from the past 48 hour(s))  Basic metabolic panel     Status: Abnormal   Collection Time: 05/01/20  8:18 PM  Result Value Ref Range   Sodium 139 135 - 145  mmol/L   Potassium 4.0 3.5 - 5.1 mmol/L   Chloride 103 98 - 111 mmol/L   CO2 27 22 - 32 mmol/L   Glucose, Bld 102 (H) 70 - 99 mg/dL    Comment: Glucose reference range applies only to samples taken after fasting for at least 8 hours.   BUN 14 6 - 20 mg/dL   Creatinine, Ser 8.14 0.44 - 1.00 mg/dL   Calcium 9.0 8.9 - 48.1 mg/dL   GFR calc non Af Amer >60 >60 mL/min   GFR calc Af Amer >60 >60 mL/min   Anion gap 9 5 - 15    Comment: Performed at [facility], [address] [place], [place] [address]  CBC with Differential     Status: None   Collection Time: 05/01/20  8:18 PM  Result Value Ref Range   WBC 8.8 4.0 - 10.5 K/uL   RBC 4.08 3.87 - 5.11 MIL/uL   Hemoglobin 13.0 12.0 - 15.0 g/dL   HCT 49.7 36 - 46 %   MCV 98.0 80.0 - 100.0 fL   MCH 31.9 26.0 - 34.0 pg   MCHC 32.5 30.0 - 36.0 g/dL   RDW 02.6 37.8 - 58.8 %   Platelets 226 150 - 400 K/uL   nRBC 0.0 0.0 - 0.2 %   Neutrophils Relative % 74 %   Neutro Abs 6.6  1.7 - 7.7 K/uL   Lymphocytes Relative 17 %   Lymphs Abs 1.5 0.7 - 4.0 K/uL   Monocytes Relative 7 %   Monocytes Absolute 0.6 0 - 1 K/uL   Eosinophils Relative 1 %   Eosinophils Absolute 0.1 0 - 0 K/uL   Basophils Relative 1 %   Basophils Absolute 0.0 0 - 0 K/uL   Immature Granulocytes 0 %   Abs Immature Granulocytes 0.03 0.00 - 0.07 K/uL    Comment: Performed at [facility], [address] [place], [place] [address]  SARS Coronavirus 2 by RT PCR (hospital order, performed in [facility] hospital lab) Nasopharyngeal Nasopharyngeal Swab     Status: None   Collection Time: 05/01/20  8:18 PM   Specimen: Nasopharyngeal Swab  Result Value Ref Range   SARS Coronavirus 2 NEGATIVE NEGATIVE    Comment: (NOTE) SARS-CoV-2 target nucleic acids are NOT DETECTED.  The SARS-CoV-2 RNA is generally detectable in upper and lower respiratory specimens during the acute phase of infection. The lowest concentration of SARS-CoV-2 viral copies this assay can  detect is 250 copies / mL. A negative result does not preclude SARS-CoV-2 infection and should not be used as the sole basis for treatment or other patient management decisions.  A negative result may occur with improper specimen collection / handling, submission of specimen other than nasopharyngeal swab, presence of viral mutation(s) within the areas targeted by this assay, and inadequate number of viral copies (<250 copies / mL). A negative result must be combined with clinical observations, patient history, and epidemiological information.  Fact Sheet for Patients:   BoilerBrush.com.cy  Fact Sheet for Healthcare Providers: https://pope.com/  This test is not yet approved or  cleared by the [place] FDA and has been authorized for detection and/or diagnosis of SARS-CoV-2 by FDA under an Emergency Use Authorization (EUA).  This EUA will remain in effect (meaning this test can be used) for the duration of the COVID-19 declaration under Section 564(b)(1) of the Act, 21 U.S.C. section 360bbb-3(b)(1), unless the authorization is terminated or revoked sooner.  Performed at [facility], [address] [place], [place] [address]     DG Hand Complete Right  Result Date: 05/01/2020 CLINICAL DATA:  Status post cat bite. EXAM: RIGHT HAND - COMPLETE 3+ VIEW COMPARISON:  None. FINDINGS: There is no evidence of fracture or dislocation. There is no evidence of arthropathy or other focal bone abnormality. Moderate to marked severity soft tissue swelling is seen along the dorsal aspect of the right hand. IMPRESSION: Moderate to marked severity soft tissue swelling without evidence of an acute osseous abnormality. Electronically Signed   By: Aram Candela M.D.   On: 05/01/2020 21:09     A comprehensive review of systems was negative except for: Constitutional: positive for fevers Review of Systems: No fevers, chills, night  sweats, chest pain, shortness of breath, nausea, vomiting, diarrhea, constipation, easy bleeding or bruising, headaches, dizziness, vision changes, fainting.   Blood pressure (!) 128/50, pulse 64, temperature 98.9 F (37.2 C), temperature source Oral, resp. rate 19, height 5\' 3"  (1.6 m), weight 52.2 kg, SpO2 98 %.  General appearance: alert, cooperative and appears stated age Head: Normocephalic, without obvious abnormality, atraumatic Neck: supple, symmetrical, trachea midline Resp: clear to auscultation bilaterally Cardio: regular rate and rhythm Extremities: Intact sensation and capillary refill all digits.  +epl/fpl/io.  Multiple bite wounds right hand and wrist.  Swelling mostly dorsally.  Able to move fingers/thumb.  Erythema dorsally.  No proximal streaking. Pulses: 2+ and symmetric Skin: Skin color, texture, turgor normal. No rashes or lesions Neurologic: Grossly normal Incision/Wound: as above  Assessment/Plan Right hand/wrist infected cat bites.  Recommend OR for incision and drainage.  Risks, benefits and alternatives of surgery were discussed including risks of blood loss, infection, damage to nerves/vessels/tendons/ligament/bone, failure of surgery, need for additional surgery, complication with wound healing, stiffness, need for repeat irrigation and debridement.  She voiced understanding of these risks and elected to proceed.    Betha Loa 05/01/2020, 11:58 PM

## 2020-05-01 NOTE — ED Triage Notes (Signed)
The neighbors cat bit her yesterday. Swelling, redness and pain today. Rabies vaccine was not UTD.

## 2020-05-02 ENCOUNTER — Emergency Department (HOSPITAL_COMMUNITY): Payer: Medicaid Other | Admitting: Registered Nurse

## 2020-05-02 ENCOUNTER — Encounter (HOSPITAL_COMMUNITY): Payer: Self-pay | Admitting: Family Medicine

## 2020-05-02 DIAGNOSIS — L02511 Cutaneous abscess of right hand: Secondary | ICD-10-CM | POA: Diagnosis not present

## 2020-05-02 DIAGNOSIS — F17210 Nicotine dependence, cigarettes, uncomplicated: Secondary | ICD-10-CM | POA: Diagnosis not present

## 2020-05-02 DIAGNOSIS — L03113 Cellulitis of right upper limb: Secondary | ICD-10-CM | POA: Diagnosis not present

## 2020-05-02 DIAGNOSIS — S61451A Open bite of right hand, initial encounter: Secondary | ICD-10-CM | POA: Diagnosis not present

## 2020-05-02 DIAGNOSIS — W5501XA Bitten by cat, initial encounter: Secondary | ICD-10-CM

## 2020-05-02 LAB — CBC
HCT: 36.5 % (ref 36.0–46.0)
Hemoglobin: 11.8 g/dL — ABNORMAL LOW (ref 12.0–15.0)
MCH: 32 pg (ref 26.0–34.0)
MCHC: 32.3 g/dL (ref 30.0–36.0)
MCV: 98.9 fL (ref 80.0–100.0)
Platelets: 190 10*3/uL (ref 150–400)
RBC: 3.69 MIL/uL — ABNORMAL LOW (ref 3.87–5.11)
RDW: 13.1 % (ref 11.5–15.5)
WBC: 7.6 10*3/uL (ref 4.0–10.5)
nRBC: 0 % (ref 0.0–0.2)

## 2020-05-02 LAB — CREATININE, SERUM
Creatinine, Ser: 0.76 mg/dL (ref 0.44–1.00)
GFR calc Af Amer: >60 mL/min (ref >60)
GFR calc non Af Amer: >60 mL/min (ref >60)

## 2020-05-02 LAB — HIV ANTIBODY (ROUTINE TESTING W REFLEX): HIV Screen 4th Generation wRfx: NONREACTIVE

## 2020-05-02 MED ORDER — HYDROCODONE-ACETAMINOPHEN 5-325 MG PO TABS
1.0000 | ORAL_TABLET | ORAL | Status: DC | PRN
  Administered 2020-05-02: 1 via ORAL
  Filled 2020-05-02: qty 1

## 2020-05-02 MED ORDER — PROMETHAZINE HCL 25 MG/ML IJ SOLN: 6.2500 mg | INTRAMUSCULAR | Status: DC | PRN

## 2020-05-02 MED ORDER — SODIUM CHLORIDE 0.9 % IV SOLN: INTRAVENOUS | Status: DC | PRN

## 2020-05-02 MED ORDER — PROPOFOL 10 MG/ML IV BOLUS
INTRAVENOUS | Status: DC | PRN
  Administered 2020-05-02: 200 mg via INTRAVENOUS

## 2020-05-02 MED ORDER — HYDROCODONE-ACETAMINOPHEN 5-325 MG PO TABS: ORAL_TABLET | ORAL | 0 refills | Status: DC

## 2020-05-02 MED ORDER — LIDOCAINE 2% (20 MG/ML) 5 ML SYRINGE
INTRAMUSCULAR | Status: DC | PRN
  Administered 2020-05-02: 100 mg via INTRAVENOUS

## 2020-05-02 MED ORDER — MORPHINE SULFATE (PF) 2 MG/ML IV SOLN: 0.5000 mg | INTRAVENOUS | Status: DC | PRN

## 2020-05-02 MED ORDER — FENTANYL CITRATE (PF) 100 MCG/2ML IJ SOLN
INTRAMUSCULAR | Status: AC
  Filled 2020-05-02: qty 2

## 2020-05-02 MED ORDER — NICOTINE 14 MG/24HR TD PT24
14.0000 mg | MEDICATED_PATCH | Freq: Every day | TRANSDERMAL | Status: DC
  Administered 2020-05-02: 14 mg via TRANSDERMAL
  Filled 2020-05-02: qty 1

## 2020-05-02 MED ORDER — SODIUM CHLORIDE 0.9 % IV SOLN
3.0000 g | Freq: Once | INTRAVENOUS | Status: AC
  Administered 2020-05-02: 3 g via INTRAVENOUS
  Filled 2020-05-02: qty 3

## 2020-05-02 MED ORDER — OXYCODONE-ACETAMINOPHEN 5-325 MG PO TABS
1.0000 | ORAL_TABLET | Freq: Four times a day (QID) | ORAL | Status: DC | PRN
  Administered 2020-05-02: 2 via ORAL
  Filled 2020-05-02: qty 2

## 2020-05-02 MED ORDER — 0.9 % SODIUM CHLORIDE (POUR BTL) OPTIME
TOPICAL | Status: DC | PRN
  Administered 2020-05-02: 1000 mL

## 2020-05-02 MED ORDER — ACETAMINOPHEN 500 MG PO TABS
500.0000 mg | ORAL_TABLET | Freq: Four times a day (QID) | ORAL | Status: DC
  Administered 2020-05-02 (×2): 500 mg via ORAL
  Filled 2020-05-02 (×2): qty 1

## 2020-05-02 MED ORDER — PROMETHAZINE HCL 25 MG/ML IJ SOLN
INTRAMUSCULAR | Status: DC | PRN
  Administered 2020-05-02: 12.5 mg via INTRAVENOUS

## 2020-05-02 MED ORDER — OXYCODONE HCL 5 MG PO TABS
5.0000 mg | ORAL_TABLET | Freq: Four times a day (QID) | ORAL | Status: DC | PRN
  Administered 2020-05-02: 5 mg via ORAL
  Filled 2020-05-02: qty 1

## 2020-05-02 MED ORDER — SENNOSIDES-DOCUSATE SODIUM 8.6-50 MG PO TABS: 1.0000 | ORAL_TABLET | Freq: Every evening | ORAL | Status: DC | PRN

## 2020-05-02 MED ORDER — SENNOSIDES-DOCUSATE SODIUM 8.6-50 MG PO TABS: 1.0000 | ORAL_TABLET | Freq: Every evening | ORAL | 0 refills | Status: AC | PRN

## 2020-05-02 MED ORDER — NICOTINE 14 MG/24HR TD PT24: 14.0000 mg | MEDICATED_PATCH | Freq: Every day | TRANSDERMAL | 0 refills | Status: AC

## 2020-05-02 MED ORDER — ENOXAPARIN SODIUM 40 MG/0.4ML ~~LOC~~ SOLN
40.0000 mg | SUBCUTANEOUS | Status: DC
  Administered 2020-05-02: 40 mg via SUBCUTANEOUS
  Filled 2020-05-02: qty 0.4

## 2020-05-02 MED ORDER — FENTANYL CITRATE (PF) 250 MCG/5ML IJ SOLN
INTRAMUSCULAR | Status: DC | PRN
  Administered 2020-05-02: 25 ug via INTRAVENOUS
  Administered 2020-05-02 (×2): 50 ug via INTRAVENOUS
  Administered 2020-05-02: 25 ug via INTRAVENOUS
  Administered 2020-05-02 (×2): 50 ug via INTRAVENOUS

## 2020-05-02 MED ORDER — HYDROMORPHONE HCL 1 MG/ML IJ SOLN
1.0000 mg | INTRAMUSCULAR | Status: DC | PRN
  Administered 2020-05-02: 1 mg via INTRAVENOUS
  Filled 2020-05-02: qty 1

## 2020-05-02 MED ORDER — SODIUM CHLORIDE 0.9 % IV SOLN: 1.5000 g | Freq: Four times a day (QID) | INTRAVENOUS | Status: DC

## 2020-05-02 MED ORDER — BUPIVACAINE HCL (PF) 0.25 % IJ SOLN
INTRAMUSCULAR | Status: DC | PRN
  Administered 2020-05-02: 15 mL

## 2020-05-02 MED ORDER — FENTANYL CITRATE (PF) 100 MCG/2ML IJ SOLN
25.0000 ug | INTRAMUSCULAR | Status: DC | PRN
  Administered 2020-05-02: 25 ug via INTRAVENOUS
  Administered 2020-05-02: 50 ug via INTRAVENOUS
  Administered 2020-05-02: 25 ug via INTRAVENOUS

## 2020-05-02 MED ORDER — EPHEDRINE SULFATE-NACL 50-0.9 MG/10ML-% IV SOSY
PREFILLED_SYRINGE | INTRAVENOUS | Status: DC | PRN
  Administered 2020-05-02: 5 mg via INTRAVENOUS

## 2020-05-02 MED ORDER — PROMETHAZINE HCL 12.5 MG RE SUPP: 12.5000 mg | Freq: Four times a day (QID) | RECTAL | Status: DC | PRN

## 2020-05-02 MED ORDER — ACETAMINOPHEN 325 MG PO TABS
325.0000 mg | ORAL_TABLET | Freq: Four times a day (QID) | ORAL | Status: DC | PRN
  Filled 2020-05-02: qty 2

## 2020-05-02 MED ORDER — SODIUM CHLORIDE 0.9 % IR SOLN
Status: DC | PRN
  Administered 2020-05-02: 3000 mL

## 2020-05-02 MED ORDER — LACTATED RINGERS IV SOLN: INTRAVENOUS | Status: DC

## 2020-05-02 MED ORDER — ASCORBIC ACID 500 MG PO TABS
1000.0000 mg | ORAL_TABLET | Freq: Every day | ORAL | Status: DC
  Administered 2020-05-02: 1000 mg via ORAL
  Filled 2020-05-02: qty 2

## 2020-05-02 MED ORDER — OXYCODONE-ACETAMINOPHEN 5-325 MG PO TABS: ORAL_TABLET | ORAL | 0 refills | Status: DC

## 2020-05-02 MED ORDER — SODIUM CHLORIDE 0.9 % IV SOLN
3.0000 g | Freq: Four times a day (QID) | INTRAVENOUS | Status: DC
  Administered 2020-05-02 (×2): 3 g via INTRAVENOUS
  Filled 2020-05-02 (×4): qty 8
  Filled 2020-05-02: qty 3

## 2020-05-02 MED ORDER — MIDAZOLAM HCL 5 MG/5ML IJ SOLN
INTRAMUSCULAR | Status: DC | PRN
  Administered 2020-05-02: 2 mg via INTRAVENOUS

## 2020-05-02 MED ORDER — PROMETHAZINE HCL 25 MG PO TABS
12.5000 mg | ORAL_TABLET | Freq: Four times a day (QID) | ORAL | Status: DC | PRN
  Administered 2020-05-02: 12.5 mg via ORAL
  Filled 2020-05-02: qty 1

## 2020-05-02 MED ORDER — AMOXICILLIN-POT CLAVULANATE 875-125 MG PO TABS: 1.0000 | ORAL_TABLET | Freq: Two times a day (BID) | ORAL | 0 refills | Status: AC

## 2020-05-02 MED ORDER — PHENYLEPHRINE HCL (PRESSORS) 10 MG/ML IV SOLN
INTRAVENOUS | Status: DC | PRN
  Administered 2020-05-02 (×2): 80 ug via INTRAVENOUS

## 2020-05-02 NOTE — Transfer of Care (Signed)
Immediate Anesthesia Transfer of Care Note  Patient: Savannah Golden  Procedure(s) Performed: INCISION AND DRAINAGE RIGHT HAND (Right Hand)  Patient Location: PACU  Anesthesia Type:General  Level of Consciousness: drowsy, patient cooperative and responds to stimulation  Airway & Oxygen Therapy: Patient Spontanous Breathing and Patient connected to face mask oxygen  Post-op Assessment: Report given to RN and Post -op Vital signs reviewed and stable  Post vital signs: Reviewed and stable  Last Vitals:  Vitals Value Taken Time  BP 125/80 05/02/20 0111  Temp    Pulse 27 05/02/20 0114  Resp 31 05/02/20 0114  SpO2 96 % 05/02/20 0114  Vitals shown include unvalidated device data.  Last Pain:  Vitals:   05/01/20 2122  TempSrc: Oral  PainSc: 6          Complications: No complications documented.

## 2020-05-02 NOTE — Anesthesia Postprocedure Evaluation (Signed)
Anesthesia Post Note  Patient: Savannah Golden  Procedure(s) Performed: INCISION AND DRAINAGE RIGHT HAND (Right Hand)     Patient location during evaluation: PACU Anesthesia Type: General Level of consciousness: sedated Pain management: pain level controlled Vital Signs Assessment: post-procedure vital signs reviewed and stable Respiratory status: spontaneous breathing and respiratory function stable Cardiovascular status: stable Postop Assessment: no apparent nausea or vomiting Anesthetic complications: no   No complications documented.  Last Vitals:  Vitals:   05/02/20 0227 05/02/20 0240  BP: 104/66 106/67  Pulse: 75 81  Resp: 16 16  Temp: 37.1 C 37 C  SpO2: 99% 100%    Last Pain:  Vitals:   05/02/20 0559  TempSrc:   PainSc: Asleep                 Leslieanne Cobarrubias DANIEL

## 2020-05-02 NOTE — Progress Notes (Signed)
Pt is requesting for prn oxy/dilaudid unfortunately it;s been d/c/d. Attending MD has been informed that pt is requesting for oxy instead of the existing order prn vicodin. Per pt she had a bad experience of taking vicodin, it made her sick/nauseated.

## 2020-05-02 NOTE — Discharge Instructions (Signed)

## 2020-05-02 NOTE — Op Note (Signed)
NAME: Savannah Golden MEDICAL RECORD NO: [id number] DATE OF BIRTH: [birth year] FACILITY: [facility] LOCATION: [facility] OR PHYSICIAN: Tami Ribas, MD   OPERATIVE REPORT   DATE OF PROCEDURE: 05/02/20    PREOPERATIVE DIAGNOSIS:   Right hand/wrist infected cat bite   POSTOPERATIVE DIAGNOSIS:   Right hand/wrist infected cat bite   PROCEDURE:   Incision and drainage right hand and wrist with 4 incisions   SURGEON:  Betha Loa, M.D.   ASSISTANT: none   ANESTHESIA:  General   INTRAVENOUS FLUIDS:  Per anesthesia flow sheet.   ESTIMATED BLOOD LOSS:  Minimal.   COMPLICATIONS:  None.   SPECIMENS:   Cultures to micro   TOURNIQUET TIME:    Total Tourniquet Time Documented: Upper Arm (Right) - 32 minutes Total: Upper Arm (Right) - 32 minutes    DISPOSITION:  Stable to PACU.   INDICATIONS: 49 year old female states she was bitten 04/30/2020 by a neighbor's cat.  She has had progressive swelling and pain of the right hand.  Subjective fevers.  Recommended incision and drainage in the operating room. Risks, benefits and alternatives of surgery were discussed including the risks of blood loss, infection, damage to nerves, vessels, tendons, ligaments, bone for surgery, need for additional surgery, complications with wound healing, continued pain, need for repeat irrigation and debridement, stiffness.  She voiced understanding of these risks and elected to proceed.  OPERATIVE COURSE:  After being identified preoperatively by myself,  the patient and I agreed on the procedure and site of the procedure.  The surgical site was marked.  Surgical consent had been signed. She was given IV antibiotics as preoperative antibiotic prophylaxis. She was transferred to the operating room and placed on the operating table in supine position with the Right upper extremity on an arm board.  General anesthesia was induced by the anesthesiologist.  Right upper extremity was prepped and draped in normal sterile orthopedic  fashion.  A surgical pause was performed between the surgeons, anesthesia, and operating room staff and all were in agreement as to the patient, procedure, and site of procedure.  Tourniquet at the proximal aspect of the extremity was inflated to 250 mmHg after exsanguination of the arm with an Esmarch bandage.    Incision was made at the base of the thumb where there were bite wounds.  No purulence was encountered.  An additional incision was made at the ulnar border the hand again where there were bite wounds.  No purulence was encountered.  Incision was made on the dorsum of the hand where the most significant of the bite wounds and the swelling and erythema were present.  There was significant watery fluid in the dorsal soft tissues.  Cultures were taken for aerobes and anaerobes.  There was damage to the fascia just radial to the ECU tendon.  The fascia was opened.  There is no gross purulence deep to the fascia.  The distal aspect of the extensor retinaculum was released.  There was fluid within the fourth dorsal compartment.  The fifth dorsal compartment was also opened.  An additional incision was made over the distal forearm.  Again the fascia over the fourth dorsal apartment tendons was opened.  There was fluid around the tendons.  The fascia over the EDQ tendon and muscle was opened.  The wounds were all copiously irrigated with sterile saline by cystoscopy tubing.  Proximally 4000 cc of irrigation was used.  The wounds were then all packed with half-inch iodoform gauze.  They were injected  with quarter percent plain Marcaine to aid in postoperative analgesia.  They were dressed with sterile 4 x 4's and ABD and wrapped with Kerlix.  A volar splint was placed and wrapped with Kerlix and Ace bandage.  The tourniquet was deflated at 32 minutes.  Fingertips were pink with brisk capillary refill after deflation of tourniquet.  The operative  drapes were broken down.  The patient was awoken from anesthesia  safely.  She was transferred back to the stretcher and taken to PACU in stable condition.    She will be kept overnight for IV antibiotics.  I will see her back in the office in 3-4 days for postoperative followup.  I will give her a prescription for Norco 5/325 1-2 tabs PO q6 hours prn pain, dispense # 20 and Augmentin 875 mg p.o. twice daily x7 days.   Betha Loa, MD Electronically signed, 05/02/20

## 2020-05-02 NOTE — Progress Notes (Signed)
Pharmacy Antibiotic Note  Savannah Golden is a 49 y.o. female admitted on 05/01/2020 with cat bite.  Pharmacy has been consulted for Unasyn dosing.  Plan: Unasyn 3gm IV q6h Will f/u renal function, micro data, and pt's clinical condition  Height: 5\' 3"  (160 cm) Weight: 52.2 kg (115 lb 1.3 oz) IBW/kg (Calculated) : 52.4  Temp (24hrs), Avg:98.8 F (37.1 C), Min:98.6 F (37 C), Max:98.9 F (37.2 C)  Recent Labs  Lab 05/01/20 2018  WBC 8.8  CREATININE 0.82    Estimated Creatinine Clearance: 69.1 mL/min (by C-G formula based on SCr of 0.82 mg/dL).    Allergies  Allergen Reactions  . Dexamethasone Anxiety    Increased seizures.    . Ketorolac Anxiety  . Topiramate Diarrhea, Nausea And Vomiting, Nausea Only and Other (See Comments)    GI problems   . Zofran [Ondansetron Hcl] Other (See Comments)    headache  . Ketorolac Tromethamine Anxiety    Antimicrobials this admission: 8/6 Unasyn >>   Microbiology results: 8/6 R hand wound:   Thank you for allowing pharmacy to be a part of this patient's care.  10/6, PharmD, BCPS Please see amion for complete clinical pharmacist phone list 05/02/2020 2:42 AM

## 2020-05-02 NOTE — Anesthesia Procedure Notes (Signed)
Procedure Name: LMA Insertion Date/Time: 05/02/2020 12:05 AM Performed by: Zollie Scale, CRNA Pre-anesthesia Checklist: Patient identified, Emergency Drugs available, Suction available and Patient being monitored Patient Re-evaluated:Patient Re-evaluated prior to induction Oxygen Delivery Method: Circle System Utilized Preoxygenation: Pre-oxygenation with 100% oxygen Induction Type: IV induction Ventilation: Mask ventilation without difficulty LMA: LMA inserted LMA Size: 4.0 Number of attempts: 1 Airway Equipment and Method: Bite block Placement Confirmation: positive ETCO2 Tube secured with: Tape Dental Injury: Teeth and Oropharynx as per pre-operative assessment

## 2020-05-02 NOTE — Plan of Care (Signed)

## 2020-05-02 NOTE — Plan of Care (Signed)
  Problem: Education: Goal: Knowledge of General Education information will improve Description: Including pain rating scale, medication(s)/side effects and non-pharmacologic comfort measures Outcome: Adequate for Discharge   Problem: Health Behavior/Discharge Planning: Goal: Ability to manage health-related needs will improve Outcome: Adequate for Discharge   Problem: Activity: Goal: Risk for activity intolerance will decrease Outcome: Adequate for Discharge   Problem: Coping: Goal: Level of anxiety will decrease Outcome: Adequate for Discharge   Problem: Elimination: Goal: Will not experience complications related to bowel motility Outcome: Adequate for Discharge   Problem: Pain Managment: Goal: General experience of comfort will improve Outcome: Adequate for Discharge

## 2020-05-02 NOTE — H&P (Signed)
History and Physical    Savannah Golden XHB:[id number] DOB: [birth year] DOA: 05/01/2020  PCP: System, Pcp Not In   Patient coming from: Home   Chief Complaint: Cat bite with swelling and redness now.  HPI: Savannah Golden is a 49 y.o. female with medical history significant for seizure disorder and TIA.  She reports that on Wednesday her neighbors cat got caught in some chicken wire fencing and she went out to try to help the cat get out of the fencing.  When she did the cat was scared and attacked her and bit her in the right hand.  Over the next 24 hours her hand became more red, swollen and painful prompting her to come to the emergency room for evaluation.  She reports she did develop subjective fever and chills over the last 24 hours.  She reports she has not had any seizure activity in the last 3 to 4 months.  She reports she has been compliant with her medications.  She denies any nausea, vomiting, diarrhea, chest pain, palpitations.  She was taken to the operating room by Dr. Merlyn Lot and had I&D of abscess performed.  Patient was seen in PACU.  Right hand and wrist is wrapped in dressing is clean and dry.   Review of Systems:  General: Denies weakness, weight loss, night sweats.  Denies dizziness.  Denies change in appetite HENT: Denies head trauma, headache, denies change in hearing, tinnitus.  Denies nasal congestion or bleeding.  Denies sore throat, sores in mouth.  Denies difficulty swallowing Eyes: Denies blurry vision, pain in eye, drainage.  Denies discoloration of eyes. Neck: Denies pain.  Denies swelling.  Denies pain with movement. Cardiovascular: Denies chest pain, palpitations.  Denies edema.  Denies orthopnea Respiratory: Denies shortness of breath, cough.  Denies wheezing.  Denies sputum production Gastrointestinal: Denies abdominal pain, swelling.  Denies nausea, vomiting, diarrhea.  Denies melena.  Denies hematemesis. Musculoskeletal: Denies limitation of movement.  Denies  deformity or swelling.  Denies pain.  Denies arthralgias or myalgias. Genitourinary: Denies pelvic pain.  Denies urinary frequency or hesitancy.  Denies dysuria.  Skin: Denies rash.  Denies petechiae, purpura, ecchymosis. Neurological: Denies headache.  Denies syncope.  Denies seizure activity.  Denies weakness or paresthesia.  Denies slurred speech, drooping face.  Denies visual change. Psychiatric: Denies depression, anxiety.  Denies suicidal thoughts or ideation.  Denies hallucinations.  Past Medical History:  Diagnosis Date  . Anxiety   . GERD (gastroesophageal reflux disease)   . MDD (major depressive disorder)   . Pseudoseizures   . Seizures (HCC)   . TIA (transient ischemic attack)     Past Surgical History:  Procedure Laterality Date  . ABDOMINAL HYSTERECTOMY    . APPENDECTOMY    . BREAST SURGERY      Social History  reports that she has been smoking cigarettes. She has a 20.00 pack-year smoking history. She has never used smokeless tobacco. She reports previous drug use. Drug: Marijuana. She reports that she does not drink alcohol.  Allergies  Allergen Reactions  . Dexamethasone Anxiety    Increased seizures.    . Ketorolac Anxiety  . Topiramate Diarrhea, Nausea And Vomiting, Nausea Only and Other (See Comments)    GI problems   . Zofran [Ondansetron Hcl] Other (See Comments)    headache  . Ketorolac Tromethamine Anxiety    Family History  Problem Relation Age of Onset  . Hypertension Mother   . Diabetes Father        had amputation  due to diabetes  . Heart attack Maternal Grandmother      Prior to Admission medications   Medication Sig Start Date End Date Taking? Authorizing Provider  amoxicillin-clavulanate (AUGMENTIN) 875-125 MG tablet Take 1 tablet by mouth 2 (two) times daily. 05/02/20   Betha LoaKuzma, Kevin, MD  cephALEXin (KEFLEX) 500 MG capsule Take 1 capsule (500 mg total) by mouth 4 (four) times daily. 11/11/18   Geoffery Lyonselo, Douglas, MD  erythromycin ophthalmic  ointment Place a 1/2 inch ribbon of ointment into the lower eyelid. 03/22/20   Mannie StabileAberman, Caroline C, PA-C  HYDROcodone-acetaminophen (NORCO) 5-325 MG tablet 1-2 tabs po q6 hours prn pain 05/02/20   Betha LoaKuzma, Kevin, MD  lithium 300 MG tablet TAKE 1 TABLET BY MOUTH TWICE DAILY FOR 30 DAYS 09/24/18   [provider]  LORazepam (ATIVAN) 1 MG tablet Take one tablet by mouth twice a day as needed for anxiety 07/28/18   [provider]  pantoprazole (PROTONIX) 20 MG tablet Take by mouth. 08/15/18   [provider]  promethazine (PHENERGAN) 25 MG tablet Take 1 tablet (25 mg total) by mouth every 6 (six) hours as needed for nausea or vomiting. 03/22/20   Mannie StabileAberman, Caroline C, PA-C    Physical Exam: Vitals:   05/02/20 0115 05/02/20 0126 05/02/20 0142 05/02/20 0157  BP:  115/71 107/63 104/65  Pulse:  93 89 78  Resp: 20 19 17 19   Temp:      TempSrc:      SpO2:  96% 98% 99%  Weight:      Height:        Constitutional: NAD, calm, comfortable Vitals:   05/02/20 0115 05/02/20 0126 05/02/20 0142 05/02/20 0157  BP:  115/71 107/63 104/65  Pulse:  93 89 78  Resp: 20 19 17 19   Temp:      TempSrc:      SpO2:  96% 98% 99%  Weight:      Height:       General: WDWN, Alert and oriented x3.  Eyes: EOMI, PERRL, lids and conjunctivae normal.  Sclera nonicteric HENT:  [place]/AT, external ears normal.  Nares patent without epistasis.  Mucous membranes are moist. Posterior pharynx clear of any exudate or lesions. Normal dentition.  Neck: Soft, normal range of motion, supple, Trachea midline Respiratory: clear to auscultation bilaterally, no wheezing, no crackles. Normal respiratory effort. No accessory muscle use.  Cardiovascular: Regular rate and rhythm, no murmurs / rubs / gallops. No extremity edema.  Abdomen: Soft, no tenderness, nondistended, no rebound or guarding.  No masses palpated. Bowel sounds normoactive Musculoskeletal: Right hand and wrist wrapped in dressing that is clean and dry.   FROM. no clubbing / cyanosis. No joint deformity upper and lower extremities. no contractures. Normal muscle tone.  Skin: Warm, dry, intact no rashes, lesions, ulcers. No induration Neurologic: CN 2-12 grossly intact.  Normal speech.  Sensation intact  Psychiatric: Normal judgment and insight.  Normal mood.    Labs on Admission: I have personally reviewed following labs and imaging studies  CBC: Recent Labs  Lab 05/01/20 2018  WBC 8.8  NEUTROABS 6.6  HGB 13.0  HCT 40.0  MCV 98.0  PLT 226    Basic Metabolic Panel: Recent Labs  Lab 05/01/20 2018  NA 139  K 4.0  CL 103  CO2 27  GLUCOSE 102*  BUN 14  CREATININE 0.82  CALCIUM 9.0    GFR: Estimated Creatinine Clearance: 69.1 mL/min (by C-G formula based on SCr of 0.82 mg/dL).  Liver  Function Tests: No results for input(s): AST, ALT, ALKPHOS, BILITOT, PROT, ALBUMIN in the last 168 hours.  Urine analysis:    Component Value Date/Time   COLORURINE YELLOW 09/16/2014 2008   APPEARANCEUR CLOUDY (A) 09/16/2014 2008   LABSPEC 1.026 09/16/2014 2008   PHURINE 6.0 09/16/2014 2008   GLUCOSEU NEGATIVE 09/16/2014 2008   HGBUR NEGATIVE 09/16/2014 2008   BILIRUBINUR SMALL (A) 09/16/2014 2008   KETONESUR 15 (A) 09/16/2014 2008   PROTEINUR NEGATIVE 09/16/2014 2008   UROBILINOGEN 1.0 09/16/2014 2008   NITRITE NEGATIVE 09/16/2014 2008   LEUKOCYTESUR NEGATIVE 09/16/2014 2008    Radiological Exams on Admission: DG Hand Complete Right  Result Date: 05/01/2020 CLINICAL DATA:  Status post cat bite. EXAM: RIGHT HAND - COMPLETE 3+ VIEW COMPARISON:  None. FINDINGS: There is no evidence of fracture or dislocation. There is no evidence of arthropathy or other focal bone abnormality. Moderate to marked severity soft tissue swelling is seen along the dorsal aspect of the right hand. IMPRESSION: Moderate to marked severity soft tissue swelling without evidence of an acute osseous abnormality. Electronically Signed   By: Aram Candela M.D.    On: 05/01/2020 21:09    Assessment/Plan Principal Problem:   Abscess of right hand Ms. Moller developed cellulitis and abscess of her right hand after a cat bite 2 days ago.  Dr. Merlyn Lot took patient to the operating room and performed an I&D of right hand.  Dr. Merlyn Lot requested hospitalist service to place patient observation and provide IV antibiotics with Unasyn overnight.  Pain control be provided.  Active Problems:   Cat bite Sustained cat bite to right hand that became infected and required I&D of abscess.    Seizures (HCC) History of seizures.  Patient states has not had a seizure in the last 3 to 4 months.  Reports she takes Lamictal at home but is unsure of the dose.  Will have pharmacy verify patient's home dose in the morning and resume medication    Cellulitis of right hand Secondary to cat bite.  Placed on Unasyn for antibiotic coverage overnight    DVT prophylaxis: Lovenox for DVT prophylaxis Code Status:   Full code Family Communication:  Diagnosis plan discussed with patient.  Patient verbalized understanding agrees with plan. Disposition Plan:   Patient is from:  Home  Anticipated DC to:  Home  Anticipated DC date:  Anticipate discharge in next 24 to 36 hours  Anticipated DC barriers: No barriers to discharge identified  Consults called:  Dr. Merlyn Lot, Hand surgery Admission status:  Observation  Severity of Illness: The appropriate patient status for this patient is OBSERVATION. Observation status is judged to be reasonable and necessary in order to provide the required intensity of service to ensure the patient's safety. The patient's presenting symptoms, physical exam findings, and initial radiographic and laboratory data in the context of their medical condition is felt to place them at decreased risk for further clinical deterioration. Furthermore, it is anticipated that the patient will be medically stable for discharge from the hospital within 2 midnights of  admission. The following factors support the patient status of observation.   " The patient's presenting symptoms include swelling of right hand after cat bite. " The physical exam findings include right hand swollen with limited flexion extension of fingers due to swelling and pain. " The initial radiographic and laboratory data are left tissue swelling on x-ray.  Patient was taken to the operating room for I&D by Dr. Merlyn Lot.  Claudean Severance Garfield Coiner MD [facility]  How to contact the [facility] Attending or Consulting provider 7A - 7P or covering provider during after hours 7P -7A, for this patient?   1. Check the care team in [facility] and look for a) attending/consulting [facility] provider listed and b) the [facility] team listed 2. Log into [email or web address] and use [place]'s universal password to access. If you do not have the password, please contact the hospital operator. 3. Locate the [facility] provider you are looking for under [facility] and page to a number that you can be directly reached. 4. If you still have difficulty reaching the provider, please page the [facility] (Director on Call) for the Hospitalists listed on amion for assistance.  05/02/2020, 2:04 AM

## 2020-05-02 NOTE — Discharge Summary (Signed)
Physician Discharge Summary  [name] ZOX:[id number] DOB: 11/05/1970 DOA: 05/01/2020  PCP: System, Pcp Not In  Admit date: 05/01/2020 Discharge date: 05/02/2020  Admitted From: Home Disposition: Home  Recommendations for Outpatient Follow-up:  1. Follow up with PCP in 1-2 weeks 2. Please obtain BMP/CBC in one week' 3. Ortho schedule followup in the office in 3-4 days for postoperative followup.   Discharge Condition: Stable CODE STATUS: Full Diet recommendation: As tolerated  Brief/Interim Summary: Butler DenmarkDebra Savannah Golden is a 49 y.o. female with medical history significant for seizure disorder and TIA.  She reports that on Wednesday her neighbors cat got caught in some chicken wire fencing and she went out to try to help the cat get out of the fencing.  When she did the cat was scared and attacked her and bit her in the right hand.  Over the next 24 hours her hand became more red, swollen and painful prompting her to come to the emergency room for evaluation.  She reports she did develop subjective fever and chills over the last 24 hours.  She reports she has not had any seizure activity in the last 3 to 4 months.  She reports she has been compliant with her medications.  She denies any nausea, vomiting, diarrhea, chest pain, palpitations.  She was taken to the operating room by Dr. Merlyn LotKuzma and had I&D of abscess performed. Patient was seen in PACU.  Right hand and wrist is wrapped in dressing is clean and dry.  Patient evaluated with orthopedic surgery for I&D of abscess after cat bite.  Tolerated procedure quite well, currently pain is well controlled, orthopedic team to see back in the office in 3-4 days for postoperative followup.  Orthopedic team has prescribed antibiotics and pain medications per protocol.  Patient otherwise stable and agreeable for discharge home.  Discharge Diagnoses:  Principal Problem:   Abscess of right hand Active Problems:   Seizures (HCC)   Cat bite   Cellulitis of right  hand    Discharge Instructions  Discharge Instructions    Call MD for:  extreme fatigue   Complete by: As directed    Call MD for:  extreme fatigue   Complete by: As directed    Call MD for:  hives   Complete by: As directed    Call MD for:  persistant dizziness or light-headedness   Complete by: As directed    Call MD for:  persistant dizziness or light-headedness   Complete by: As directed    Call MD for:  persistant nausea and vomiting   Complete by: As directed    Call MD for:  redness, tenderness, or signs of infection (pain, swelling, redness, odor or green/yellow discharge around incision site)   Complete by: As directed    Call MD for:  redness, tenderness, or signs of infection (pain, swelling, redness, odor or green/yellow discharge around incision site)   Complete by: As directed    Call MD for:  severe uncontrolled pain   Complete by: As directed    Call MD for:  severe uncontrolled pain   Complete by: As directed    Call MD for:  temperature >100.4   Complete by: As directed    Call MD for:  temperature >100.4   Complete by: As directed    Diet - low sodium heart healthy   Complete by: As directed    Discharge wound care:   Complete by: As directed    Per Orthopedic surgeon   Increase  activity slowly   Complete by: As directed    Increase activity slowly   Complete by: As directed    Leave dressing on - Keep it clean, dry, and intact until clinic visit   Complete by: As directed      Allergies as of 05/02/2020      Reactions   Dexamethasone Anxiety   Increased seizures.    Ketorolac Anxiety   Topiramate Diarrhea, Nausea And Vomiting, Nausea Only, Other (See Comments)   GI problems   Zofran [ondansetron Hcl] Other (See Comments)   headache   Hydrocodone-acetaminophen Nausea And Vomiting   Ketorolac Tromethamine Anxiety      Medication List    STOP taking these medications   cephALEXin 500 MG capsule Commonly known as: KEFLEX     TAKE these  medications   amoxicillin-clavulanate 875-125 MG tablet Commonly known as: Augmentin Take 1 tablet by mouth 2 (two) times daily.   erythromycin ophthalmic ointment Place a 1/2 inch ribbon of ointment into the lower eyelid.   FLUoxetine 40 MG capsule Commonly known as: PROZAC Take 40 mg by mouth daily.   FLUoxetine 20 MG capsule Commonly known as: PROZAC Take 20 mg by mouth daily.   lamoTRIgine 100 MG tablet Commonly known as: LAMICTAL Take 100 mg by mouth at bedtime.   LORazepam 1 MG tablet Commonly known as: ATIVAN Take 1 mg by mouth 2 (two) times daily as needed.   nicotine 14 mg/24hr patch Commonly known as: NICODERM CQ - dosed in mg/24 hours Place 1 patch (14 mg total) onto the skin daily.   oxyCODONE-acetaminophen 5-325 MG tablet Commonly known as: Percocet 1-2 tabs PO q6 hours prn pain   pantoprazole 20 MG tablet Commonly known as: PROTONIX Take by mouth.   promethazine 25 MG tablet Commonly known as: PHENERGAN Take 1 tablet (25 mg total) by mouth every 6 (six) hours as needed for nausea or vomiting.   senna-docusate 8.6-50 MG tablet Commonly known as: Senokot-S Take 1 tablet by mouth at bedtime as needed for mild constipation.            Discharge Care Instructions  (From admission, onward)         Start     Ordered   05/02/20 0000  Discharge wound care:       Comments: Per Orthopedic surgeon   05/02/20 0806   05/02/20 0000  Leave dressing on - Keep it clean, dry, and intact until clinic visit        05/02/20 1253          Follow-up Information    Betha Loa, MD On 05/05/2020.   Specialty: Orthopedic Surgery Contact information: [address] [place] [address] [phone]              Allergies  Allergen Reactions   Dexamethasone Anxiety    Increased seizures.     Ketorolac Anxiety   Topiramate Diarrhea, Nausea And Vomiting, Nausea Only and Other (See Comments)    GI problems    Zofran [Ondansetron Hcl] Other  (See Comments)    headache   Hydrocodone-Acetaminophen Nausea And Vomiting   Ketorolac Tromethamine Anxiety    Consultations: Orthopedic surgery  Procedures/Studies: DG Hand Complete Right  Result Date: 05/01/2020 CLINICAL DATA:  Status post cat bite. EXAM: RIGHT HAND - COMPLETE 3+ VIEW COMPARISON:  None. FINDINGS: There is no evidence of fracture or dislocation. There is no evidence of arthropathy or other focal bone abnormality. Moderate to marked severity soft tissue swelling  is seen along the dorsal aspect of the right hand. IMPRESSION: Moderate to marked severity soft tissue swelling without evidence of an acute osseous abnormality. Electronically Signed   By: Aram Candela M.D.   On: 05/01/2020 21:09     Subjective: No acute issues or events overnight, denies nausea, vomiting, diarrhea, constipation, headache, fevers, chills.   Discharge Exam: Vitals:   05/02/20 0240 05/02/20 0754  BP: 106/67 111/77  Pulse: 81 80  Resp: 16 17  Temp: 98.6 F (37 C) 98.6 F (37 C)  SpO2: 100% 100%   Vitals:   05/02/20 0212 05/02/20 0227 05/02/20 0240 05/02/20 0754  BP: 106/65 104/66 106/67 111/77  Pulse: 74 75 81 80  Resp: 16 16 16 17   Temp:  98.7 F (37.1 C) 98.6 F (37 C) 98.6 F (37 C)  TempSrc:   Oral Oral  SpO2: 98% 99% 100% 100%  Weight:      Height:        General: Pt is alert, awake, not in acute distress Cardiovascular: RRR, S1/S2 +, no rubs, no gallops Respiratory: CTA bilaterally, no wheezing, no rhonchi Abdominal: Soft, NT, ND, bowel sounds + Extremities: Right upper extremity bandage clean dry intact, otherwise without edema, no cyanosis    The results of significant diagnostics from this hospitalization (including imaging, microbiology, ancillary and laboratory) are listed below for reference.     Microbiology: Recent Results (from the past 240 hour(s))  SARS Coronavirus 2 by RT PCR (hospital order, performed in [facility] hospital lab) Nasopharyngeal  Nasopharyngeal Swab     Status: None   Collection Time: 05/01/20  8:18 PM   Specimen: Nasopharyngeal Swab  Result Value Ref Range Status   SARS Coronavirus 2 NEGATIVE NEGATIVE Final    Comment: (NOTE) SARS-CoV-2 target nucleic acids are NOT DETECTED.  The SARS-CoV-2 RNA is generally detectable in upper and lower respiratory specimens during the acute phase of infection. The lowest concentration of SARS-CoV-2 viral copies this assay can detect is 250 copies / mL. A negative result does not preclude SARS-CoV-2 infection and should not be used as the sole basis for treatment or other patient management decisions.  A negative result may occur with improper specimen collection / handling, submission of specimen other than nasopharyngeal swab, presence of viral mutation(s) within the areas targeted by this assay, and inadequate number of viral copies (<250 copies / mL). A negative result must be combined with clinical observations, patient history, and epidemiological information.  Fact Sheet for Patients:   07/01/20  Fact Sheet for Healthcare Providers: BoilerBrush.com.cy  This test is not yet approved or  cleared by the https://pope.com/ FDA and has been authorized for detection and/or diagnosis of SARS-CoV-2 by FDA under an Emergency Use Authorization (EUA).  This EUA will remain in effect (meaning this test can be used) for the duration of the COVID-19 declaration under Section 564(b)(1) of the Act, 21 U.S.C. section 360bbb-3(b)(1), unless the authorization is terminated or revoked sooner.  Performed at [facility], [address] [place], [place]   Aerobic/Anaerobic Culture (surgical/deep wound)     Status: None (Preliminary result)   Collection Time: 05/01/20 11:40 PM   Specimen: PATH Other; Tissue  Result Value Ref Range Status   Specimen Description WOUND RIGHT HAND  Final   Special Requests PATIENT  ON FOLLOWING ZOSYN  Final   Gram Stain   Final    RARE WBC PRESENT, PREDOMINANTLY PMN NO ORGANISMS SEEN Performed at [facility], [address] [place], [place] [address]    Culture PENDING  Incomplete   Report Status PENDING  Incomplete     Labs: BNP (last 3 results) No results for input(s): BNP in the last 8760 hours. Basic Metabolic Panel: Recent Labs  Lab 05/01/20 2018 05/02/20 0800  NA 139  --   K 4.0  --   CL 103  --   CO2 27  --   GLUCOSE 102*  --   BUN 14  --   CREATININE 0.82 0.76  CALCIUM 9.0  --    Liver Function Tests: No results for input(s): AST, ALT, ALKPHOS, BILITOT, PROT, ALBUMIN in the last 168 hours. No results for input(s): LIPASE, AMYLASE in the last 168 hours. No results for input(s): AMMONIA in the last 168 hours. CBC: Recent Labs  Lab 05/01/20 2018 05/02/20 0800  WBC 8.8 7.6  NEUTROABS 6.6  --   HGB 13.0 11.8*  HCT 40.0 36.5  MCV 98.0 98.9  PLT 226 190   Cardiac Enzymes: No results for input(s): CKTOTAL, CKMB, CKMBINDEX, TROPONINI in the last 168 hours. BNP: Invalid input(s): POCBNP CBG: No results for input(s): GLUCAP in the last 168 hours. D-Dimer No results for input(s): DDIMER in the last 72 hours. Hgb A1c No results for input(s): HGBA1C in the last 72 hours. Lipid Profile No results for input(s): CHOL, HDL, LDLCALC, TRIG, CHOLHDL, LDLDIRECT in the last 72 hours. Thyroid function studies No results for input(s): TSH, T4TOTAL, T3FREE, THYROIDAB in the last 72 hours.  Invalid input(s): FREET3 Anemia work up No results for input(s): VITAMINB12, FOLATE, FERRITIN, TIBC, IRON, RETICCTPCT in the last 72 hours. Urinalysis    Component Value Date/Time   COLORURINE YELLOW 09/16/2014 2008   APPEARANCEUR CLOUDY (A) 09/16/2014 2008   LABSPEC 1.026 09/16/2014 2008   PHURINE 6.0 09/16/2014 2008   GLUCOSEU NEGATIVE 09/16/2014 2008   HGBUR NEGATIVE 09/16/2014 2008   BILIRUBINUR SMALL (A) 09/16/2014 2008   KETONESUR 15 (A)  09/16/2014 2008   PROTEINUR NEGATIVE 09/16/2014 2008   UROBILINOGEN 1.0 09/16/2014 2008   NITRITE NEGATIVE 09/16/2014 2008   LEUKOCYTESUR NEGATIVE 09/16/2014 2008   Sepsis Labs Invalid input(s): PROCALCITONIN,  WBC,  LACTICIDVEN Microbiology Recent Results (from the past 240 hour(s))  SARS Coronavirus 2 by RT PCR (hospital order, performed in [facility] hospital lab) Nasopharyngeal Nasopharyngeal Swab     Status: None   Collection Time: 05/01/20  8:18 PM   Specimen: Nasopharyngeal Swab  Result Value Ref Range Status   SARS Coronavirus 2 NEGATIVE NEGATIVE Final    Comment: (NOTE) SARS-CoV-2 target nucleic acids are NOT DETECTED.  The SARS-CoV-2 RNA is generally detectable in upper and lower respiratory specimens during the acute phase of infection. The lowest concentration of SARS-CoV-2 viral copies this assay can detect is 250 copies / mL. A negative result does not preclude SARS-CoV-2 infection and should not be used as the sole basis for treatment or other patient management decisions.  A negative result may occur with improper specimen collection / handling, submission of specimen other than nasopharyngeal swab, presence of viral mutation(s) within the areas targeted by this assay, and inadequate number of viral copies (<250 copies / mL). A negative result must be combined with clinical observations, patient history, and epidemiological information.  Fact Sheet for Patients:   BoilerBrush.com.cy  Fact Sheet for Healthcare Providers: https://pope.com/  This test is not yet approved or  cleared by the [place] FDA and has been authorized for detection and/or diagnosis  of SARS-CoV-2 by FDA under an Emergency Use Authorization (EUA).  This EUA will remain in effect (meaning this test can be used) for the duration of the COVID-19 declaration under Section 564(b)(1) of the Act, 21 U.S.C. section 360bbb-3(b)(1), unless the  authorization is terminated or revoked sooner.  Performed at [facility], [address] [place], [place] [address]   Aerobic/Anaerobic Culture (surgical/deep wound)     Status: None (Preliminary result)   Collection Time: 05/01/20 11:40 PM   Specimen: PATH Other; Tissue  Result Value Ref Range Status   Specimen Description WOUND RIGHT HAND  Final   Special Requests PATIENT ON FOLLOWING ZOSYN  Final   Gram Stain   Final    RARE WBC PRESENT, PREDOMINANTLY PMN NO ORGANISMS SEEN Performed at [facility], [address] [place], [place] [address]    Culture PENDING  Incomplete   Report Status PENDING  Incomplete     Time coordinating discharge: Over 30 minutes  SIGNED:   Azucena Fallen, DO [facility] 05/02/2020, 12:53 PM Pager   If 7PM-7AM, please contact night-coverage [email or web address]

## 2020-05-02 NOTE — Progress Notes (Signed)
Discharge summary provided to pt/spouse with instructions. Pt/spouse verbalized understanding of instructions.Wound care management explained thoroughly with pt/spouse on proper care as indicated. No complaints voiced. Pt alert/oriented in no apparent distress.D/C as ordered. Spouse is responsible for pt's transport.

## 2020-05-07 LAB — AEROBIC/ANAEROBIC CULTURE W GRAM STAIN (SURGICAL/DEEP WOUND): Culture: NO GROWTH

## 2021-07-13 IMAGING — CR DG HAND COMPLETE 3+V*R*
3 series · 3 of 3 positions shown · non-contrast
Comparison: None.

CLINICAL DATA: Status post cat bite.

EXAM:
RIGHT HAND - COMPLETE 3+ VIEW

[x hand pa right]
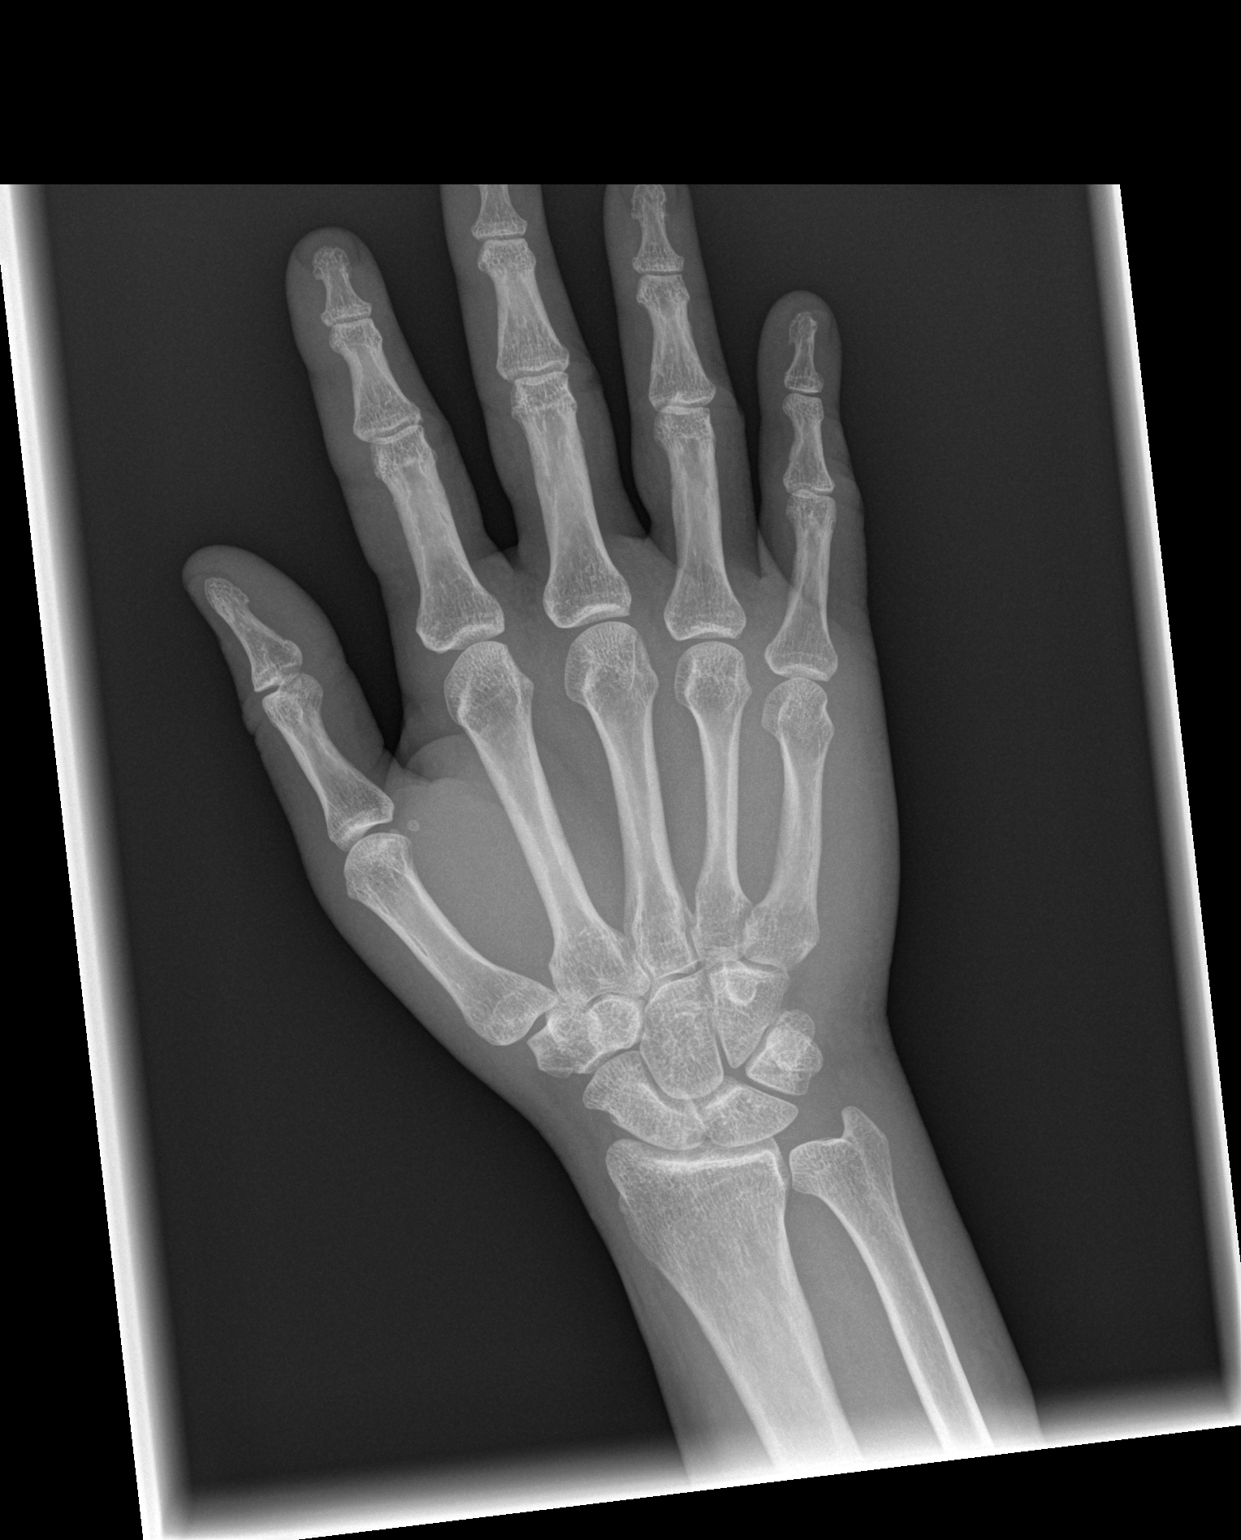

[x hand oblique right]
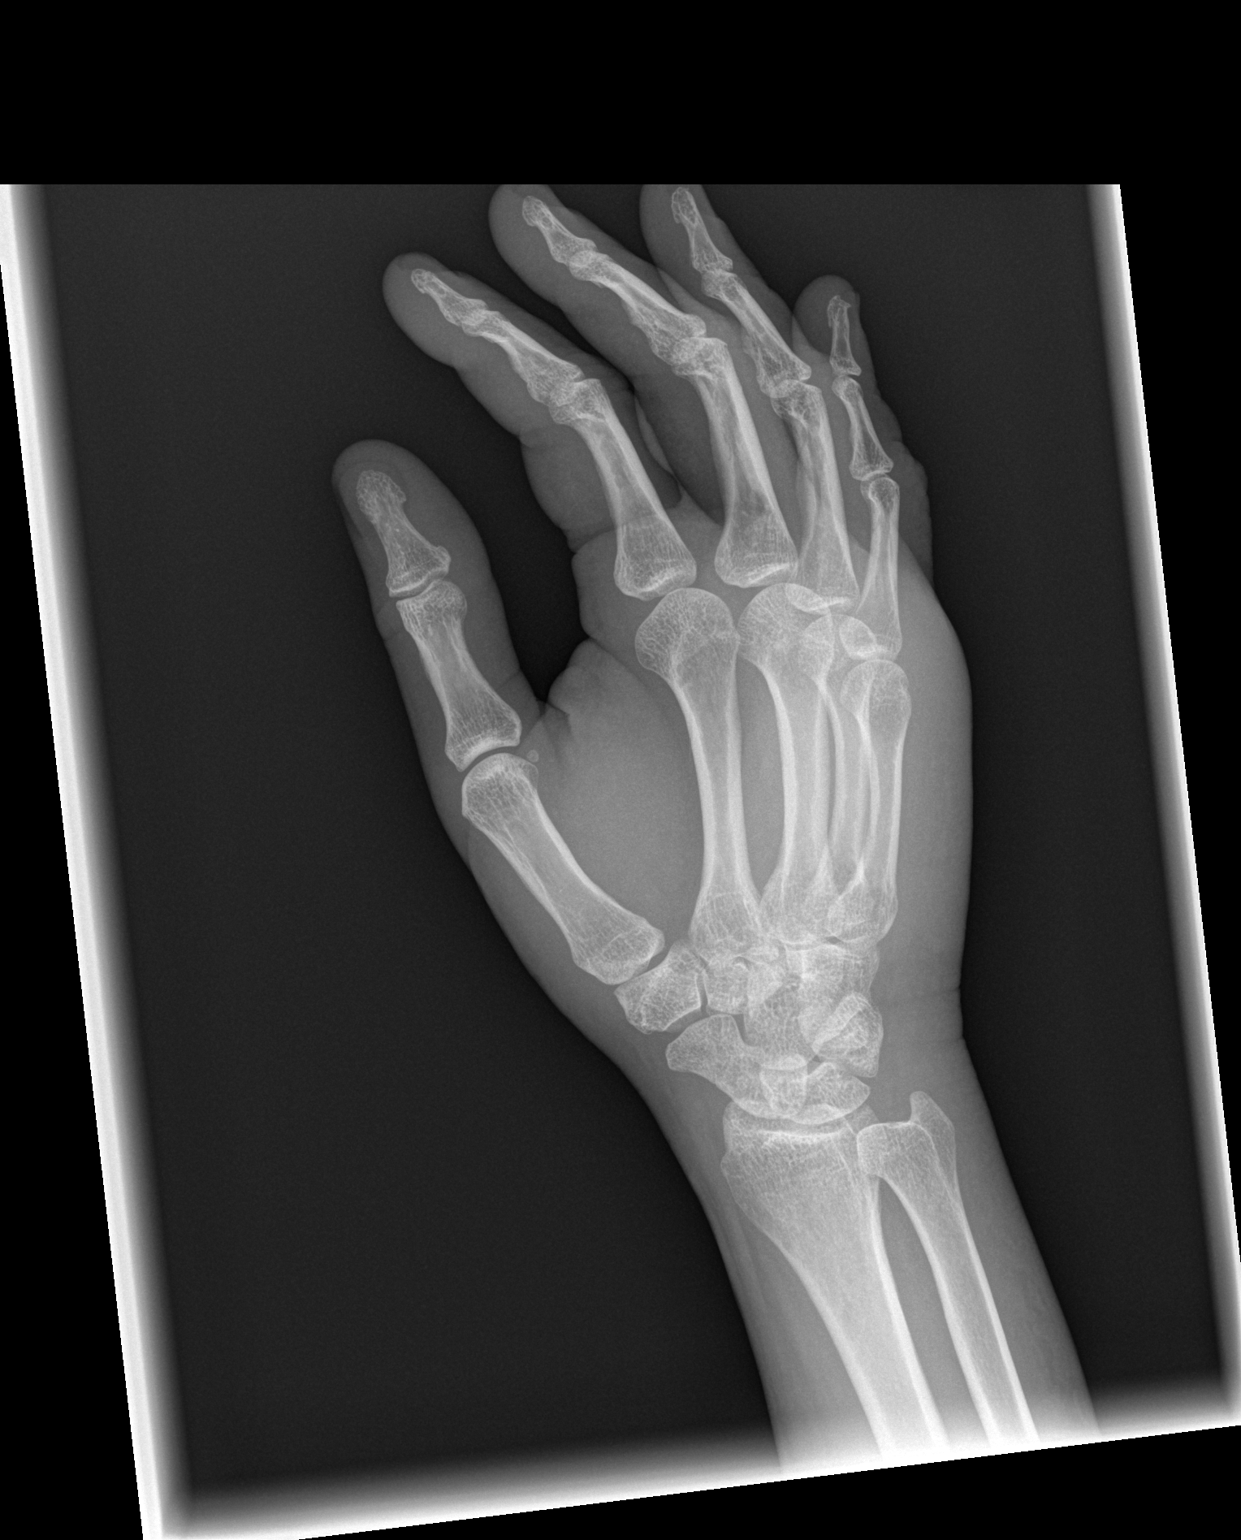

[x hand lat right]
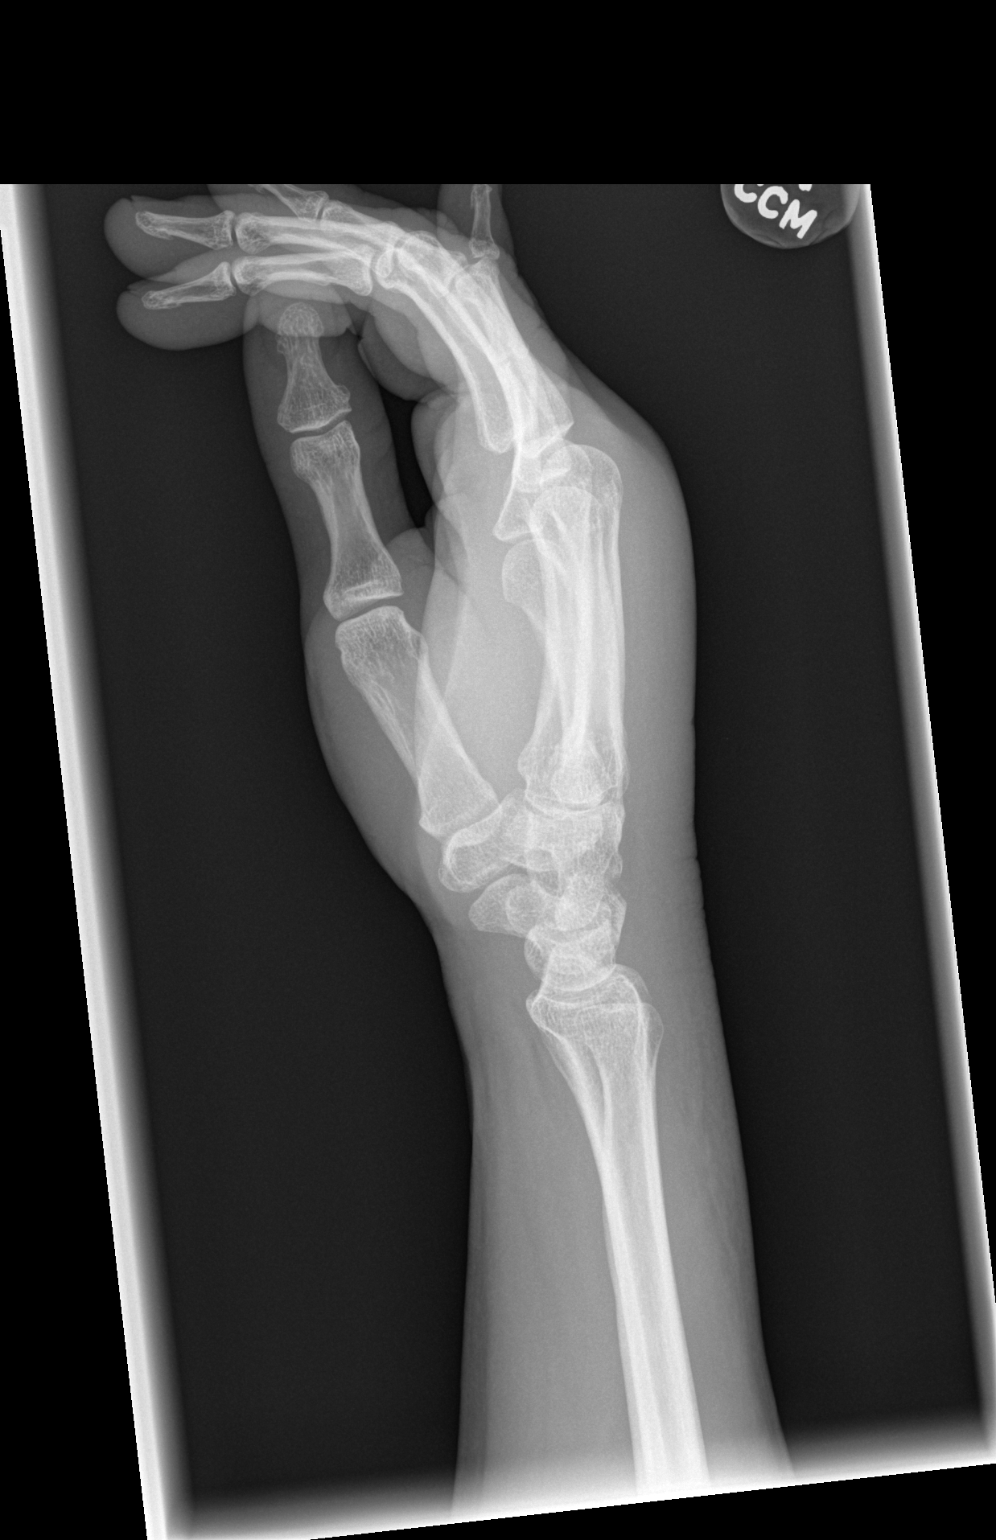

[3 of 3 positions shown; findings below may reference images not displayed]

FINDINGS: There is no evidence of fracture or dislocation. There is no
evidence of arthropathy or other focal bone abnormality. Moderate to
marked severity soft tissue swelling is seen along the dorsal aspect
of the right hand.
IMPRESSION: Moderate to marked severity soft tissue swelling without evidence of
an acute osseous abnormality.

## 2022-09-10 ENCOUNTER — Other Ambulatory Visit: Payer: Self-pay

## 2022-09-10 ENCOUNTER — Emergency Department (HOSPITAL_BASED_OUTPATIENT_CLINIC_OR_DEPARTMENT_OTHER)
Admission: EM | Admit: 2022-09-10 | Discharge: 2022-09-10 | Disposition: A | Discharge status: Home / Self Care | Payer: Medicaid Other | Attending: Emergency Medicine | Admitting: Emergency Medicine

## 2022-09-10 ENCOUNTER — Emergency Department (HOSPITAL_BASED_OUTPATIENT_CLINIC_OR_DEPARTMENT_OTHER): Payer: Medicaid Other

## 2022-09-10 DIAGNOSIS — J09X2 Influenza due to identified novel influenza A virus with other respiratory manifestations: Secondary | ICD-10-CM | POA: Insufficient documentation

## 2022-09-10 DIAGNOSIS — J101 Influenza due to other identified influenza virus with other respiratory manifestations: Secondary | ICD-10-CM

## 2022-09-10 DIAGNOSIS — R509 Fever, unspecified: Secondary | ICD-10-CM | POA: Diagnosis present

## 2022-09-10 DIAGNOSIS — Z20822 Contact with and (suspected) exposure to covid-19: Secondary | ICD-10-CM | POA: Diagnosis not present

## 2022-09-10 LAB — RESP PANEL BY RT-PCR (RSV, FLU A&B, COVID)  RVPGX2
Influenza A by PCR: POSITIVE — AB
Influenza B by PCR: NEGATIVE
Resp Syncytial Virus by PCR: NEGATIVE
SARS Coronavirus 2 by RT PCR: NEGATIVE

## 2022-09-10 LAB — GROUP A STREP BY PCR: Group A Strep by PCR: NOT DETECTED

## 2022-09-10 MED ORDER — ALBUTEROL SULFATE (2.5 MG/3ML) 0.083% IN NEBU
5.0000 mg | INHALATION_SOLUTION | Freq: Once | RESPIRATORY_TRACT | Status: AC
  Administered 2022-09-10: 5 mg via RESPIRATORY_TRACT
  Filled 2022-09-10: qty 6

## 2022-09-10 MED ORDER — PREDNISONE 20 MG PO TABS: 40.0000 mg | ORAL_TABLET | Freq: Every day | ORAL | 0 refills | Status: AC

## 2022-09-10 MED ORDER — PREDNISONE 20 MG PO TABS
40.0000 mg | ORAL_TABLET | Freq: Once | ORAL | Status: AC
  Administered 2022-09-10: 40 mg via ORAL
  Filled 2022-09-10: qty 2

## 2022-09-10 MED ORDER — ALBUTEROL SULFATE HFA 108 (90 BASE) MCG/ACT IN AERS
2.0000 | INHALATION_SPRAY | RESPIRATORY_TRACT | Status: DC | PRN
  Administered 2022-09-10: 2 via RESPIRATORY_TRACT
  Filled 2022-09-10: qty 6.7

## 2022-09-10 NOTE — Discharge Instructions (Signed)
Use your inhaler every 4-6 hours as needed for coughing wheezing and shortness of breath.  The steroids are for the next 5 days to help with inflammation and to hopefully prevent or worsening bronchitis.  Make sure you continue to take Tylenol but you can also try Voltaren gel which is over-the-counter and you can rub it where it hurts.  Make sure you are staying hydrated and gets lots of rest.

## 2022-09-10 NOTE — ED Provider Notes (Signed)
[facility] EMERGENCY DEPARTMENT Provider Note   CSN: [id number] Arrival date & time: 09/10/22  1811     History  Chief Complaint  Patient presents with   Fever    Savannah Golden is a 51 y.o. female.  Patient is a 51 year old female with a history of seizures, prior TIA, depression, ongoing tobacco use who is presenting today with a 6-day history of fever, body aches, cough, chest pain, more recent mild shortness of breath and feeling generally unwell.  The chest pain is present with coughing and taking a deep breath.  She feels winded when getting up and moving around.  She has been taking Tylenol which does help with the fever but has not helped with the body aches.  She has not had vomiting or urinary symptoms.  She does not use an inhaler at home.  The history is provided by the patient.  Fever      Home Medications Prior to Admission medications   Medication Sig Start Date End Date Taking? Authorizing Provider  predniSONE (DELTASONE) 20 MG tablet Take 2 tablets (40 mg total) by mouth daily. 09/10/22  Yes Gwyneth Sprout, MD  amoxicillin-clavulanate (AUGMENTIN) 875-125 MG tablet Take 1 tablet by mouth 2 (two) times daily. 05/02/20   Betha Loa, MD  erythromycin ophthalmic ointment Place a 1/2 inch ribbon of ointment into the lower eyelid. Patient not taking: Reported on 05/02/2020 03/22/20   Mannie Stabile, PA-C  FLUoxetine (PROZAC) 20 MG capsule Take 20 mg by mouth daily.    [provider]  FLUoxetine (PROZAC) 40 MG capsule Take 40 mg by mouth daily.    [provider]  lamoTRIgine (LAMICTAL) 100 MG tablet Take 100 mg by mouth at bedtime. 04/17/20   [provider]  LORazepam (ATIVAN) 1 MG tablet Take 1 mg by mouth 2 (two) times daily as needed.  07/28/18   [provider]  nicotine (NICODERM CQ - DOSED IN MG/24 HOURS) 14 mg/24hr patch Place 1 patch (14 mg total) onto the skin daily. 05/02/20   Azucena Fallen, MD   oxyCODONE-acetaminophen (PERCOCET) 5-325 MG tablet 1-2 tabs PO q6 hours prn pain 05/02/20   Betha Loa, MD  pantoprazole (PROTONIX) 20 MG tablet Take by mouth. 08/15/18   [provider]  promethazine (PHENERGAN) 25 MG tablet Take 1 tablet (25 mg total) by mouth every 6 (six) hours as needed for nausea or vomiting. 03/22/20   Audelia Acton, Merla Riches, PA-C  senna-docusate (SENOKOT-S) 8.6-50 MG tablet Take 1 tablet by mouth at bedtime as needed for mild constipation. 05/02/20   Azucena Fallen, MD      Allergies    Dexamethasone, Ketorolac, Topiramate, Zofran [ondansetron hcl], Hydrocodone-acetaminophen, and Ketorolac tromethamine    Review of Systems   Review of Systems  Constitutional:  Positive for fever.    Physical Exam Updated Vital Signs BP 115/68   Pulse 74   Temp 98.4 F (36.9 C) (Oral)   Resp 19   Ht 5\' 3"  (1.6 m)   Wt 56.7 kg   SpO2 94%   BMI 22.14 kg/m  Physical Exam Vitals and nursing note reviewed.  Constitutional:      General: She is not in acute distress.    Appearance: She is well-developed.  HENT:     Head: Normocephalic and atraumatic.     Mouth/Throat:     Pharynx: Posterior oropharyngeal erythema present.  Eyes:     Pupils: Pupils are equal, round, and reactive to light.  Cardiovascular:  Rate and Rhythm: Normal rate and regular rhythm.     Heart sounds: Normal heart sounds. No murmur heard.    No friction rub.  Pulmonary:     Effort: Pulmonary effort is normal.     Breath sounds: Wheezing present. No rales.     Comments: Recurrent coughing with expiration.  Tight wheezy cough Abdominal:     General: Bowel sounds are normal. There is no distension.     Palpations: Abdomen is soft.     Tenderness: There is no abdominal tenderness. There is no guarding or rebound.  Musculoskeletal:        General: No tenderness. Normal range of motion.     Comments: No edema  Skin:    General: Skin is warm and dry.     Findings: No rash.   Neurological:     Mental Status: She is alert and oriented to person, place, and time.     Cranial Nerves: No cranial nerve deficit.  Psychiatric:        Behavior: Behavior normal.     ED Results / Procedures / Treatments   Labs (all labs ordered are listed, but only abnormal results are displayed) Labs Reviewed  RESP PANEL BY RT-PCR (RSV, FLU A&B, COVID)  [id number] - Abnormal; Notable for the following components:      Result Value   Influenza A by PCR POSITIVE (*)    All other components within normal limits  GROUP A STREP BY PCR    EKG EKG Interpretation  Date/Time:  Friday September 10 2022 19:55:21 EST Ventricular Rate:  70 PR Interval:  139 QRS Duration: 107 QT Interval:  413 QTC Calculation: 446 R Axis:   -48 Text Interpretation: Sinus rhythm Incomplete RBBB and LAFB RSR' in V1 or V2, right VCD or RVH ST elev, probable normal early repol pattern No significant change since last tracing Confirmed by Gwyneth Sprout ([id number]) on 09/10/2022 7:57:34 PM  Radiology DG Chest Port 1 View  Result Date: 09/10/2022 CLINICAL DATA:  Fever x5 days EXAM: PORTABLE CHEST 1 VIEW COMPARISON:  03/22/2022 FINDINGS: Lungs are clear.  No pleural effusion or pneumothorax. The heart is normal in size. IMPRESSION: No evidence of acute cardiopulmonary disease. Electronically Signed   By: Charline Bills M.D.   On: 09/10/2022 19:50    Procedures Procedures    Medications Ordered in ED Medications  albuterol (VENTOLIN HFA) 108 (90 Base) MCG/ACT inhaler 2 puff (has no administration in time range)  predniSONE (DELTASONE) tablet 40 mg (has no administration in time range)  albuterol (PROVENTIL) (2.5 MG/3ML) 0.083% nebulizer solution 5 mg (5 mg Nebulization Given 09/10/22 2034)    ED Course/ Medical Decision Making/ A&P                           Medical Decision Making Amount and/or Complexity of Data Reviewed Radiology: ordered and independent interpretation performed. Decision-making  details documented in ED Course. ECG/medicine tests: ordered and independent interpretation performed. Decision-making details documented in ED Course.  Risk Prescription drug management.   Pt with multiple medical problems and comorbidities and presenting today with a complaint that caries a high risk for morbidity and mortality. Pt with symptoms consistent with influenza.  Normal exam here and is afebrile except for tight wheezy cough.  No signs of breathing difficulty.  No signs of strep pharyngitis, otitis or abnormal abdominal findings.   I have independently visualized and interpreted pt's images today.  CXR wnl and  I independently interpreted patient's labs and rapid strep wnl.  Flu A positive. Will continue antipyretica and rest and fluids and return for any further problems.  After breathing treatment pt is breathing more comfortable and wheezing improved.  Will d/c with steroids and inhaler.  Return precautions given.  I independently evaluated pt's EKG which is unchanged from prior.        Final Clinical Impression(s) / ED Diagnoses Final diagnoses:  Influenza A    Rx / DC Orders ED Discharge Orders          Ordered    predniSONE (DELTASONE) 20 MG tablet  Daily        09/10/22 2141              Gwyneth Sprout, MD 09/10/22 2142

## 2022-09-10 NOTE — ED Triage Notes (Signed)
Pt POV steady gait- C/o persistent fever since Sunday, c/o chest pain/back pain worse with coughing since yesterday. Productive cough. Last tylenol 1600 today.

## 2023-01-23 ENCOUNTER — Encounter (HOSPITAL_BASED_OUTPATIENT_CLINIC_OR_DEPARTMENT_OTHER): Payer: Self-pay | Admitting: Emergency Medicine

## 2023-01-23 ENCOUNTER — Other Ambulatory Visit: Payer: Self-pay

## 2023-01-23 ENCOUNTER — Emergency Department (HOSPITAL_BASED_OUTPATIENT_CLINIC_OR_DEPARTMENT_OTHER): Payer: Medicaid Other

## 2023-01-23 ENCOUNTER — Emergency Department (HOSPITAL_BASED_OUTPATIENT_CLINIC_OR_DEPARTMENT_OTHER)
Admission: EM | Admit: 2023-01-23 | Discharge: 2023-01-23 | Disposition: A | Discharge status: Home / Self Care | Payer: Medicaid Other | Attending: Emergency Medicine | Admitting: Emergency Medicine

## 2023-01-23 DIAGNOSIS — S91212A Laceration without foreign body of left great toe with damage to nail, initial encounter: Secondary | ICD-10-CM | POA: Diagnosis not present

## 2023-01-23 DIAGNOSIS — Z23 Encounter for immunization: Secondary | ICD-10-CM | POA: Insufficient documentation

## 2023-01-23 DIAGNOSIS — Y9319 Activity, other involving water and watercraft: Secondary | ICD-10-CM | POA: Diagnosis not present

## 2023-01-23 DIAGNOSIS — W25XXXA Contact with sharp glass, initial encounter: Secondary | ICD-10-CM | POA: Insufficient documentation

## 2023-01-23 DIAGNOSIS — S99922A Unspecified injury of left foot, initial encounter: Secondary | ICD-10-CM | POA: Diagnosis present

## 2023-01-23 DIAGNOSIS — S91112A Laceration without foreign body of left great toe without damage to nail, initial encounter: Secondary | ICD-10-CM

## 2023-01-23 MED ORDER — OXYCODONE HCL 5 MG PO TABS
5.0000 mg | ORAL_TABLET | Freq: Once | ORAL | Status: AC
  Administered 2023-01-23: 5 mg via ORAL
  Filled 2023-01-23: qty 1

## 2023-01-23 MED ORDER — TETANUS-DIPHTH-ACELL PERTUSSIS 5-2.5-18.5 LF-MCG/0.5 IM SUSY
0.5000 mL | PREFILLED_SYRINGE | Freq: Once | INTRAMUSCULAR | Status: AC
  Administered 2023-01-23: 0.5 mL via INTRAMUSCULAR
  Filled 2023-01-23: qty 0.5

## 2023-01-23 MED ORDER — ONDANSETRON 4 MG PO TBDP
4.0000 mg | ORAL_TABLET | Freq: Once | ORAL | Status: AC
  Administered 2023-01-23: 4 mg via ORAL
  Filled 2023-01-23: qty 1

## 2023-01-23 MED ORDER — LIDOCAINE HCL 2 % IJ SOLN
20.0000 mL | Freq: Once | INTRAMUSCULAR | Status: AC
  Administered 2023-01-23: 400 mg
  Filled 2023-01-23: qty 20

## 2023-01-23 MED ORDER — CIPROFLOXACIN HCL 500 MG PO TABS: 500.0000 mg | ORAL_TABLET | Freq: Two times a day (BID) | ORAL | 0 refills | Status: AC
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
## 2023-01-23 NOTE — Discharge Instructions (Addendum)
You came to the emergency department today with a laceration to your foot.  This was repaired and the stitches need to come out in 7 to 10 days.  I am starting you on an antibiotic called ciprofloxacin.  This may cause GI upset so try and take it with food.  There is a podiatrist on these discharge papers for you to follow-up with for a wound check.  I would like this to occur within the next 3 days.  If you are unable to get in with them you may see any orthopedic.  Tylenol and ibuprofen are good options for your pain.  Do not hesitate to return to the emergency department with any further concerns.

## 2023-01-23 NOTE — ED Triage Notes (Signed)
Reports cutting left foot on glass while fishing today. Tetanus unknown.

## 2023-02-01 ENCOUNTER — Other Ambulatory Visit: Payer: Self-pay

## 2023-02-01 ENCOUNTER — Emergency Department (HOSPITAL_BASED_OUTPATIENT_CLINIC_OR_DEPARTMENT_OTHER)
Admission: EM | Admit: 2023-02-01 | Discharge: 2023-02-01 | Disposition: A | Discharge status: Home / Self Care | Payer: Medicaid Other | Attending: Emergency Medicine | Admitting: Emergency Medicine

## 2023-02-01 ENCOUNTER — Encounter (HOSPITAL_BASED_OUTPATIENT_CLINIC_OR_DEPARTMENT_OTHER): Payer: Self-pay

## 2023-02-01 DIAGNOSIS — X58XXXD Exposure to other specified factors, subsequent encounter: Secondary | ICD-10-CM | POA: Diagnosis not present

## 2023-02-01 DIAGNOSIS — Z4802 Encounter for removal of sutures: Secondary | ICD-10-CM | POA: Diagnosis not present

## 2023-02-01 DIAGNOSIS — S91112D Laceration without foreign body of left great toe without damage to nail, subsequent encounter: Secondary | ICD-10-CM | POA: Diagnosis not present

## 2023-02-01 NOTE — ED Notes (Signed)
D/c paperwork reviewed with pt, including follow up care.  No questions or concerns voiced at time of d/c. . Pt verbalized understanding, Ambulatory without assistance to ED exit, NAD.   

## 2023-02-01 NOTE — ED Triage Notes (Signed)
Pt returns for stiches removal

## 2023-02-01 NOTE — Discharge Instructions (Addendum)
Please follow-up with your primary care provider and when I attached your for you for your recent ER visit.  Today 8 sutures were taken out and there is no signs of infection.  Please continue to monitor your wound if symptoms change please return to ER.

## 2023-02-01 NOTE — ED Provider Notes (Signed)
[facility] Provider Note   CSN: [id number] Arrival date & time: 02/01/23  2203     History  Chief Complaint  Patient presents with   Suture / Staple Removal    Savannah Golden is a 52 y.o. female presented for suture removal.  Patient states that she had laceration on her left great toe and had 8 sutures placed need to be removed in 10 days and today is day 10.  Patient states that the wound has been healing nicely and she is taking the ciprofloxacin without issue.  Patient denies any fevers or chills, discharge, pain in the area, skin color changes, changes in sensation/motor skill, nausea/vomiting.  Patient states she is asymptomatic and just wants sutures out.    Home Medications Prior to Admission medications   Medication Sig Start Date End Date Taking? Authorizing Provider  amoxicillin-clavulanate (AUGMENTIN) 875-125 MG tablet Take 1 tablet by mouth 2 (two) times daily. 05/02/20   Betha Loa, MD  erythromycin ophthalmic ointment Place a 1/2 inch ribbon of ointment into the lower eyelid. Patient not taking: Reported on 05/02/2020 03/22/20   Mannie Stabile, PA-C  FLUoxetine (PROZAC) 20 MG capsule Take 20 mg by mouth daily.    [provider]  FLUoxetine (PROZAC) 40 MG capsule Take 40 mg by mouth daily.    [provider]  lamoTRIgine (LAMICTAL) 100 MG tablet Take 100 mg by mouth at bedtime. 04/17/20   [provider]  LORazepam (ATIVAN) 1 MG tablet Take 1 mg by mouth 2 (two) times daily as needed.  07/28/18   [provider]  nicotine (NICODERM CQ - DOSED IN MG/24 HOURS) 14 mg/24hr patch Place 1 patch (14 mg total) onto the skin daily. 05/02/20   Azucena Fallen, MD  oxyCODONE-acetaminophen (PERCOCET) 5-325 MG tablet 1-2 tabs PO q6 hours prn pain 05/02/20   Betha Loa, MD  pantoprazole (PROTONIX) 20 MG tablet Take by mouth. 08/15/18   [provider]  predniSONE (DELTASONE) 20 MG tablet Take  2 tablets (40 mg total) by mouth daily. 09/10/22   Gwyneth Sprout, MD  promethazine (PHENERGAN) 25 MG tablet Take 1 tablet (25 mg total) by mouth every 6 (six) hours as needed for nausea or vomiting. 03/22/20   Audelia Acton, Merla Riches, PA-C  senna-docusate (SENOKOT-S) 8.6-50 MG tablet Take 1 tablet by mouth at bedtime as needed for mild constipation. 05/02/20   Azucena Fallen, MD      Allergies    Dexamethasone, Ketorolac, Topiramate, Zofran Frazier Richards hcl], Hydrocodone-acetaminophen, and Ketorolac tromethamine    Review of Systems   Review of Systems See HPI Physical Exam Updated Vital Signs BP 121/80 (BP Location: Right Arm)   Pulse 99   Temp 97.7 F (36.5 C)   Resp 18   Ht 5\' 2"  (1.575 m)   Wt 55.8 kg   SpO2 100%   BMI 22.50 kg/m  Physical Exam Constitutional:      General: She is not in acute distress. Cardiovascular:     Comments: 2+ right-sided dorsalis pedal pulse with regular rate Musculoskeletal:     Comments: Able to fully range left great toe pain Left great toe: Nontender to palpation, soft compartments, no step-off/crepitus/abnormalities palpated  Skin:    General: Skin is warm and dry.     Capillary Refill: Capillary refill takes less than 2 seconds.     Comments: Laceration noted at distal end of left great toe without any drainage or signs of erythema, no  signs of the wound being dehisced, wound healing nicely with granulation tissue  Neurological:     Mental Status: She is alert.     Comments: Sensation intact distally  Psychiatric:        Mood and Affect: Mood normal.     ED Results / Procedures / Treatments   Labs (all labs ordered are listed, but only abnormal results are displayed) Labs Reviewed - No data to display  EKG None  Radiology No results found.  Procedures .Suture Removal  Date/Time: 02/01/2023 10:39 PM  Performed by: Netta Corrigan, PA-C Authorized by: Netta Corrigan, PA-C   Consent:    Consent obtained:  Verbal    Consent given by:  Patient   Risks, benefits, and alternatives were discussed: yes     Risks discussed:  Bleeding, pain and wound separation   Alternatives discussed:  No treatment Universal protocol:    Patient identity confirmed:  Verbally with patient Location:    Location:  Lower extremity   Lower extremity location:  Toe   Toe location:  L big toe Procedure details:    Wound appearance:  No signs of infection   Number of sutures removed:  8 Post-procedure details:    Post-removal:  No dressing applied   Procedure completion:  Tolerated     Medications Ordered in ED Medications - No data to display  ED Course/ Medical Decision Making/ A&P                             Medical Decision Making  [name] 52 y.o. presented today for suture removal. Working DDx that I considered at this time includes, but not limited to, wound healing, wound dehisced, wound infection, cellulitis, abscess, osteomyelitis, ischemic limb, neurovascular compromise.  R/o DDx: wound dehisced, wound infection, cellulitis, abscess, osteomyelitis, ischemic limb, neurovascular compromise: These are considered less likely due to history of present illness and physical exam findings  Review of prior external notes: 01/23/2023 ED  Unique Tests and My Interpretation: None  Discussion with Independent Historian: None  Discussion of Management of Tests: None  Risk: Low: based on diagnostic testing/clinical impression and treatment plan  Risk Stratification Score: none  Plan: Patient presented for suture removal. On exam patient was in no acute distress and stable vitals.  Patient unremarkable physical exam.  In the previous provider's note 8 sutures were placed and today 8 sutures were removed.  I did not note any signs of infection and encouraged patient continue taking her ciprofloxacin that she was prescribed.  Patient was encouraged to follow-up with her primary care provider and to monitor her toe  in case symptoms worsen.  Patient was given return precautions. Patient stable for discharge at this time.  Patient verbalized understanding of plan.         Final Clinical Impression(s) / ED Diagnoses Final diagnoses:  Visit for suture removal    Rx / DC Orders ED Discharge Orders     None         Remi Deter 02/01/23 2240    Alvira Monday, MD 02/02/23 1400

## 2023-08-29 ENCOUNTER — Other Ambulatory Visit: Payer: Self-pay

## 2023-08-29 ENCOUNTER — Emergency Department (HOSPITAL_BASED_OUTPATIENT_CLINIC_OR_DEPARTMENT_OTHER): Payer: Medicaid Other

## 2023-08-29 ENCOUNTER — Emergency Department (HOSPITAL_BASED_OUTPATIENT_CLINIC_OR_DEPARTMENT_OTHER)
Admission: EM | Admit: 2023-08-29 | Discharge: 2023-08-29 | Disposition: A | Discharge status: Home / Self Care | Payer: Medicaid Other | Attending: Emergency Medicine | Admitting: Emergency Medicine

## 2023-08-29 ENCOUNTER — Encounter (HOSPITAL_BASED_OUTPATIENT_CLINIC_OR_DEPARTMENT_OTHER): Payer: Self-pay | Admitting: Emergency Medicine

## 2023-08-29 DIAGNOSIS — M25531 Pain in right wrist: Secondary | ICD-10-CM | POA: Insufficient documentation

## 2023-08-29 DIAGNOSIS — L02411 Cutaneous abscess of right axilla: Secondary | ICD-10-CM | POA: Insufficient documentation

## 2023-08-29 MED ORDER — DOXYCYCLINE HYCLATE 100 MG PO CAPS: 100.0000 mg | ORAL_CAPSULE | Freq: Two times a day (BID) | ORAL | 0 refills | Status: AC

## 2023-08-29 MED ORDER — LIDOCAINE-EPINEPHRINE (PF) 2 %-1:200000 IJ SOLN
20.0000 mL | Freq: Once | INTRAMUSCULAR | Status: AC
  Administered 2023-08-29: 20 mL
  Filled 2023-08-29: qty 20

## 2023-08-29 MED ORDER — OXYCODONE-ACETAMINOPHEN 5-325 MG PO TABS: 1.0000 | ORAL_TABLET | Freq: Three times a day (TID) | ORAL | 0 refills | Status: AC | PRN

## 2023-08-29 MED ORDER — OXYCODONE-ACETAMINOPHEN 5-325 MG PO TABS
1.0000 | ORAL_TABLET | Freq: Once | ORAL | Status: AC
  Administered 2023-08-29: 1 via ORAL
  Filled 2023-08-29: qty 1

## 2023-08-29 MED ORDER — DOXYCYCLINE HYCLATE 100 MG PO TABS
100.0000 mg | ORAL_TABLET | Freq: Once | ORAL | Status: AC
  Administered 2023-08-29: 100 mg via ORAL
  Filled 2023-08-29: qty 1

## 2023-08-29 NOTE — ED Triage Notes (Signed)
Lump on right wrist about a month ago, is painful and limits mobility, also cut right under arm shaving about 3 days ago, now has 3 "Cyst" that are painful.

## 2023-08-29 NOTE — ED Provider Notes (Signed)
[place] [facility] Provider Note   CSN: [id number] Arrival date & time: 08/29/23  0119     History  Chief Complaint  Patient presents with   Abscess    Savannah Golden is a 52 y.o. female.  Patient states she was shaving her armpit about 3 days ago when she developed a small cut.  Now she has 3 painful areas under her armpit 1 of which is draining pus.  Has noticed surrounding erythema spreading to her chest wall.  No fever but has felt warm.  No difficulty breathing or chest pain.  Also with pain to her right wrist for the past 1 month with some swelling near her radius.  Denies injury.  No focal weakness, numbness or tingling.  No fever, redness, swelling.  No history of diabetes.  The history is provided by the patient and the spouse.  Abscess Associated symptoms: no fever, no headaches, no nausea and no vomiting        Home Medications Prior to Admission medications   Medication Sig Start Date End Date Taking? Authorizing Provider  amoxicillin-clavulanate (AUGMENTIN) 875-125 MG tablet Take 1 tablet by mouth 2 (two) times daily. 05/02/20   Betha Loa, MD  erythromycin ophthalmic ointment Place a 1/2 inch ribbon of ointment into the lower eyelid. Patient not taking: Reported on 05/02/2020 03/22/20   Mannie Stabile, PA-C  FLUoxetine (PROZAC) 20 MG capsule Take 20 mg by mouth daily.    [provider]  FLUoxetine (PROZAC) 40 MG capsule Take 40 mg by mouth daily.    [provider]  lamoTRIgine (LAMICTAL) 100 MG tablet Take 100 mg by mouth at bedtime. 04/17/20   [provider]  LORazepam (ATIVAN) 1 MG tablet Take 1 mg by mouth 2 (two) times daily as needed.  07/28/18   [provider]  nicotine (NICODERM CQ - DOSED IN MG/24 HOURS) 14 mg/24hr patch Place 1 patch (14 mg total) onto the skin daily. 05/02/20   Azucena Fallen, MD  oxyCODONE-acetaminophen (PERCOCET) 5-325 MG tablet 1-2 tabs PO q6 hours prn  pain 05/02/20   Betha Loa, MD  pantoprazole (PROTONIX) 20 MG tablet Take by mouth. 08/15/18   [provider]  predniSONE (DELTASONE) 20 MG tablet Take 2 tablets (40 mg total) by mouth daily. 09/10/22   Gwyneth Sprout, MD  promethazine (PHENERGAN) 25 MG tablet Take 1 tablet (25 mg total) by mouth every 6 (six) hours as needed for nausea or vomiting. 03/22/20   Audelia Acton, Merla Riches, PA-C  senna-docusate (SENOKOT-S) 8.6-50 MG tablet Take 1 tablet by mouth at bedtime as needed for mild constipation. 05/02/20   Azucena Fallen, MD      Allergies    Dexamethasone, Ketorolac, Topiramate, Zofran [ondansetron hcl], Hydrocodone-acetaminophen, and Ketorolac tromethamine    Review of Systems   Review of Systems  Constitutional:  Negative for activity change, appetite change and fever.  HENT:  Negative for congestion and rhinorrhea.   Respiratory:  Negative for chest tightness and shortness of breath.   Cardiovascular:  Negative for chest pain.  Gastrointestinal:  Negative for abdominal pain, nausea and vomiting.  Genitourinary:  Negative for dysuria.  Musculoskeletal:  Positive for arthralgias and myalgias.  Skin:  Positive for wound.  Neurological:  Negative for dizziness, weakness and headaches.   all other systems are negative except as noted in the HPI and PMH.    Physical Exam Updated Vital Signs BP 135/89 (BP Location: Right Arm)   Pulse Marland Kitchen)  107   Temp 98.7 F (37.1 C) (Oral)   Resp 20   Ht 5\' 3"  (1.6 m)   Wt 54.4 kg   SpO2 100%   BMI 21.26 kg/m  Physical Exam Vitals and nursing note reviewed.  Constitutional:      General: She is not in acute distress.    Appearance: She is well-developed.  HENT:     Head: Normocephalic and atraumatic.     Mouth/Throat:     Pharynx: No oropharyngeal exudate.  Eyes:     Conjunctiva/sclera: Conjunctivae normal.     Pupils: Pupils are equal, round, and reactive to light.  Neck:     Comments: No meningismus. Cardiovascular:      Rate and Rhythm: Normal rate and regular rhythm.     Heart sounds: Normal heart sounds. No murmur heard. Pulmonary:     Effort: Pulmonary effort is normal. No respiratory distress.     Breath sounds: Normal breath sounds.  Abdominal:     Palpations: Abdomen is soft.     Tenderness: There is no abdominal tenderness. There is no guarding or rebound.  Musculoskeletal:        General: Tenderness present. Normal range of motion.     Cervical back: Normal range of motion and neck supple.     Comments: Erythema to right axilla.  There are 2 small nodules 0.5 cm 1 of which is draining purulent material.  Tenderness to right distal radius without fluctuance or erythema.  Full range of motion without bony tenderness.  Intact radial pulse  Skin:    General: Skin is warm.  Neurological:     Mental Status: She is alert and oriented to person, place, and time.     Cranial Nerves: No cranial nerve deficit.     Motor: No abnormal muscle tone.     Coordination: Coordination normal.     Comments:  5/5 strength throughout. CN 2-12 intact.Equal grip strength.   Psychiatric:        Behavior: Behavior normal.     ED Results / Procedures / Treatments   Labs (all labs ordered are listed, but only abnormal results are displayed) Labs Reviewed - No data to display  EKG None  Radiology No results found.  Procedures .Incision and Drainage  Date/Time: 08/29/2023 2:33 AM  Performed by: Glynn Octave, MD Authorized by: Glynn Octave, MD   Consent:    Consent obtained:  Verbal   Consent given by:  Patient   Risks, benefits, and alternatives were discussed: yes     Risks discussed:  Bleeding, damage to other organs, infection, incomplete drainage and pain   Alternatives discussed:  No treatment Universal protocol:    Procedure explained and questions answered to patient or proxy's satisfaction: yes     Relevant documents present and verified: yes     Immediately prior to procedure, a time  out was called: yes     Patient identity confirmed:  Verbally with patient Location:    Type:  Abscess   Size:  1   Location: axilla. Sedation:    Sedation type:  None Anesthesia:    Anesthesia method:  Local infiltration   Local anesthetic:  Lidocaine 2% WITH epi Procedure type:    Complexity:  Simple Procedure details:    Ultrasound guidance: no     Needle aspiration: no     Incision types:  Stab incision   Incision depth:  Subcutaneous   Wound management:  Probed and deloculated and irrigated with saline  Drainage amount:  Copious   Wound treatment:  Wound left open   Packing materials:  None Post-procedure details:    Procedure completion:  Tolerated well, no immediate complications     Medications Ordered in ED Medications  lidocaine-EPINEPHrine (XYLOCAINE W/EPI) 2 %-1:200000 (PF) injection 20 mL (has no administration in time range)  oxyCODONE-acetaminophen (PERCOCET/ROXICET) 5-325 MG per tablet 1 tablet (has no administration in time range)    ED Course/ Medical Decision Making/ A&P                                 Medical Decision Making Amount and/or Complexity of Data Reviewed Labs: ordered. Decision-making details documented in ED Course. Radiology: ordered and independent interpretation performed. Decision-making details documented in ED Course. ECG/medicine tests: ordered and independent interpretation performed. Decision-making details documented in ED Course.  Risk Prescription drug management.   Draining abscess to right axilla.  1 area is open and draining but another nodule may require drainage.  Tetanus up-to-date  Will x-ray her wrist that is painful  Incision drainage performed as above with purulence expressed.  Patient given antibiotics and wound care instructions.  X-ray of right wrist is negative for fracture but does show soft tissue swelling results reviewed and interpreted by me.  Will place in spica splint and follow-up with hand  surgery with concern for possible occult fracture versus ligament damage.  Discussed wound check by PCP in 2 days.  Return to the ED sooner with worsening pain, spreading redness, fever or any other concerns.        Final Clinical Impression(s) / ED Diagnoses Final diagnoses:  Abscess of axilla, right    Rx / DC Orders ED Discharge Orders     None         Janautica Netzley, Jeannett Senior, MD 08/29/23 [phone]

## 2023-08-29 NOTE — Discharge Instructions (Signed)
Perform warm soaks twice daily as we discussed and take the antibiotics as prescribed.  Follow-up with your doctor for recheck of the wound in 2 days.  Return to the ED sooner with worsening pain, spreading redness, fever, any other concerns.  Follow-up with a hand surgeon regarding your swelling of your wrist and wear the splint until follow-up.  X-rays negative for fracture.  Sometimes fractures may not be seen on x-ray immediately.  You may also have ligament damage.

## 2024-03-07 ENCOUNTER — Emergency Department (HOSPITAL_BASED_OUTPATIENT_CLINIC_OR_DEPARTMENT_OTHER)
Admission: EM | Admit: 2024-03-07 | Discharge: 2024-03-08 | Discharge status: Against Medical Advice | Attending: Emergency Medicine | Admitting: Emergency Medicine

## 2024-03-07 ENCOUNTER — Encounter (HOSPITAL_BASED_OUTPATIENT_CLINIC_OR_DEPARTMENT_OTHER): Payer: Self-pay

## 2024-03-07 ENCOUNTER — Other Ambulatory Visit: Payer: Self-pay

## 2024-03-07 DIAGNOSIS — R220 Localized swelling, mass and lump, head: Secondary | ICD-10-CM | POA: Diagnosis not present

## 2024-03-07 DIAGNOSIS — R21 Rash and other nonspecific skin eruption: Secondary | ICD-10-CM | POA: Insufficient documentation

## 2024-03-07 DIAGNOSIS — Z5321 Procedure and treatment not carried out due to patient leaving prior to being seen by health care provider: Secondary | ICD-10-CM | POA: Insufficient documentation

## 2024-03-07 NOTE — ED Triage Notes (Signed)
 Complaining of a rash on the face that started Saturday. The eye and cheek started swelling yesterday and is getting worse.
